# Patient Record
Sex: Male | Born: 1935 | Race: White | Hispanic: No | Marital: Married | Smoking: Former smoker
Health system: Southern US, Community
[De-identification: ages and names within clinical notes are randomized; demographics above are authoritative.]

## PROBLEM LIST (undated history)

## (undated) DIAGNOSIS — I1310 Hypertensive heart and chronic kidney disease without heart failure, with stage 1 through stage 4 chronic kidney disease, or unspecified chronic kidney disease: Secondary | ICD-10-CM

## (undated) DIAGNOSIS — K219 Gastro-esophageal reflux disease without esophagitis: Secondary | ICD-10-CM

## (undated) DIAGNOSIS — E782 Mixed hyperlipidemia: Secondary | ICD-10-CM

## (undated) DIAGNOSIS — I10 Essential (primary) hypertension: Secondary | ICD-10-CM

## (undated) DIAGNOSIS — I2510 Atherosclerotic heart disease of native coronary artery without angina pectoris: Secondary | ICD-10-CM

## (undated) DIAGNOSIS — I480 Paroxysmal atrial fibrillation: Secondary | ICD-10-CM

## (undated) DIAGNOSIS — B159 Hepatitis A without hepatic coma: Secondary | ICD-10-CM

## (undated) DIAGNOSIS — N183 Chronic kidney disease, stage 3 (moderate): Secondary | ICD-10-CM

## (undated) DIAGNOSIS — E1122 Type 2 diabetes mellitus with diabetic chronic kidney disease: Secondary | ICD-10-CM

## (undated) DIAGNOSIS — I252 Old myocardial infarction: Secondary | ICD-10-CM

## (undated) DIAGNOSIS — I429 Cardiomyopathy, unspecified: Secondary | ICD-10-CM

## (undated) DIAGNOSIS — Z86711 Personal history of pulmonary embolism: Secondary | ICD-10-CM

## (undated) DIAGNOSIS — I119 Hypertensive heart disease without heart failure: Secondary | ICD-10-CM

## (undated) HISTORY — DX: Chronic kidney disease, stage 3 (moderate): N18.3

## (undated) HISTORY — DX: Paroxysmal atrial fibrillation: I48.0

## (undated) HISTORY — DX: Hypertensive heart and chronic kidney disease without heart failure, with stage 1 through stage 4 chronic kidney disease, or unspecified chronic kidney disease: I13.10

## (undated) HISTORY — DX: Cardiomyopathy, unspecified: I42.9

## (undated) HISTORY — DX: Mixed hyperlipidemia: E78.2

## (undated) HISTORY — PX: APPENDECTOMY: SHX54

## (undated) HISTORY — DX: Hypertensive heart disease without heart failure: I11.9

## (undated) HISTORY — DX: Atherosclerotic heart disease of native coronary artery without angina pectoris: I25.10

## (undated) HISTORY — DX: Old myocardial infarction: I25.2

## (undated) HISTORY — PX: CORONARY ARTERY BYPASS GRAFT: SHX141

## (undated) HISTORY — DX: Type 2 diabetes mellitus with diabetic chronic kidney disease: E11.22

## (undated) HISTORY — DX: Essential (primary) hypertension: I10

## (undated) HISTORY — PX: CYSTOSCOPY: SUR368

## (undated) HISTORY — PX: PROSTATECTOMY: SHX69

## (undated) HISTORY — PX: CHOLECYSTECTOMY: SHX55

## (undated) HISTORY — PX: INSERT / REPLACE / REMOVE PACEMAKER: SUR710

## (undated) HISTORY — DX: Personal history of pulmonary embolism: Z86.711

## (undated) HISTORY — DX: Gastro-esophageal reflux disease without esophagitis: K21.9

## (undated) HISTORY — DX: Hepatitis a without hepatic coma: B15.9

---

## 2000-08-01 ENCOUNTER — Ambulatory Visit (HOSPITAL_COMMUNITY): Admission: RE | Admit: 2000-08-01 | Discharge: 2000-08-01 | Payer: Self-pay | Admitting: *Deleted

## 2003-11-25 ENCOUNTER — Ambulatory Visit (HOSPITAL_COMMUNITY): Admission: RE | Admit: 2003-11-25 | Discharge: 2003-11-25 | Payer: Self-pay | Admitting: General Surgery

## 2003-12-18 ENCOUNTER — Encounter: Admission: RE | Admit: 2003-12-18 | Discharge: 2003-12-18 | Payer: Self-pay | Admitting: General Surgery

## 2003-12-21 ENCOUNTER — Ambulatory Visit (HOSPITAL_COMMUNITY): Admission: RE | Admit: 2003-12-21 | Discharge: 2003-12-21 | Payer: Self-pay | Admitting: General Surgery

## 2004-02-25 ENCOUNTER — Encounter: Admission: RE | Admit: 2004-02-25 | Discharge: 2004-02-25 | Payer: Self-pay | Admitting: General Surgery

## 2004-02-29 ENCOUNTER — Ambulatory Visit (HOSPITAL_COMMUNITY): Admission: RE | Admit: 2004-02-29 | Discharge: 2004-02-29 | Payer: Self-pay | Admitting: General Surgery

## 2004-02-29 ENCOUNTER — Ambulatory Visit (HOSPITAL_BASED_OUTPATIENT_CLINIC_OR_DEPARTMENT_OTHER): Admission: RE | Admit: 2004-02-29 | Discharge: 2004-02-29 | Payer: Self-pay | Admitting: General Surgery

## 2006-02-07 IMAGING — CR DG CHEST 2V
2 series · 2 of 2 positions shown · non-contrast
Comparison: none

CLINICAL DATA: Preop respiratory exam surgery, left ear.  Post cardiac pacemaker, CABG.
CHEST, TWO VIEWS ? 02/25/04:
The heart size and mediastinal contours are unremarkable.  The lungs are clear.  The visualized skeleton is unremarkable.  There is no interval change since [HOSPITAL] at [HOSPITAL] chest x-ray of 12/18/03 with left sided dual lead permanent cardiac pacemaker and post CABG changes.
IMPRESSION
Since 12/18/03 - no active disease.

[view not recorded (1 of 2)]
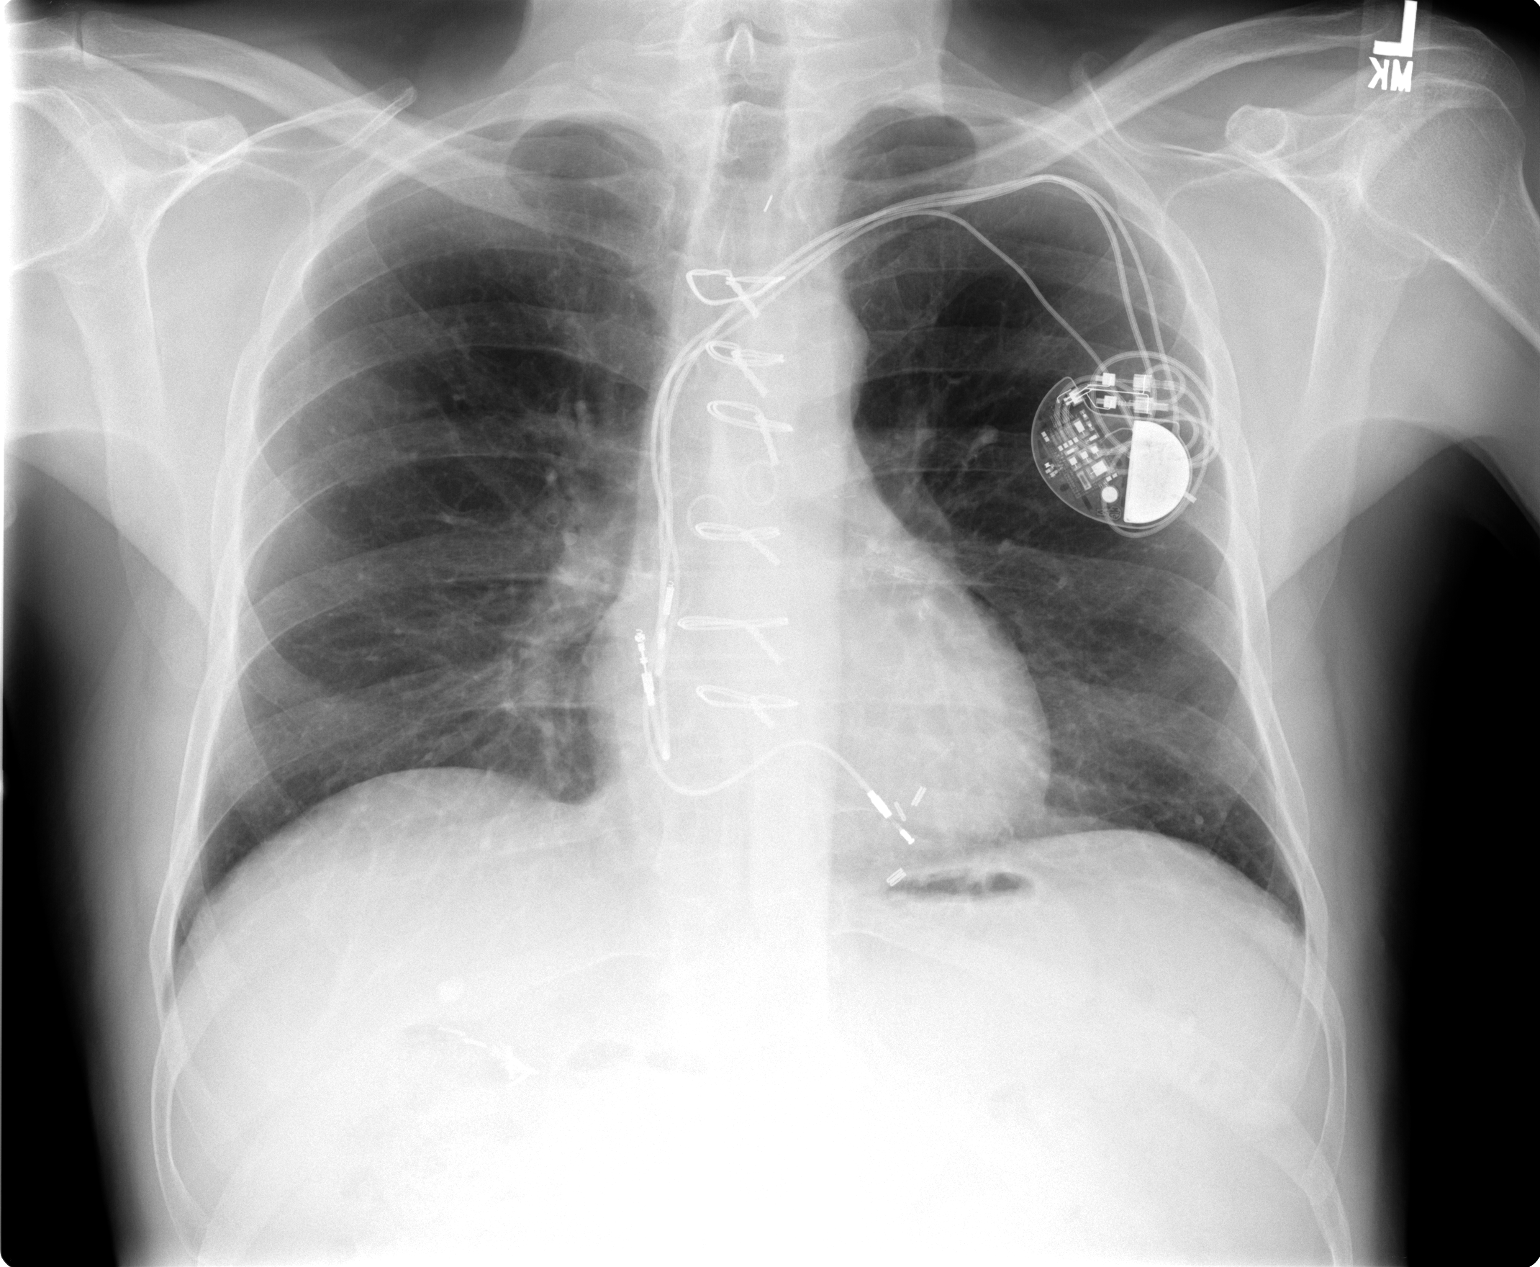

[view not recorded (2 of 2)]
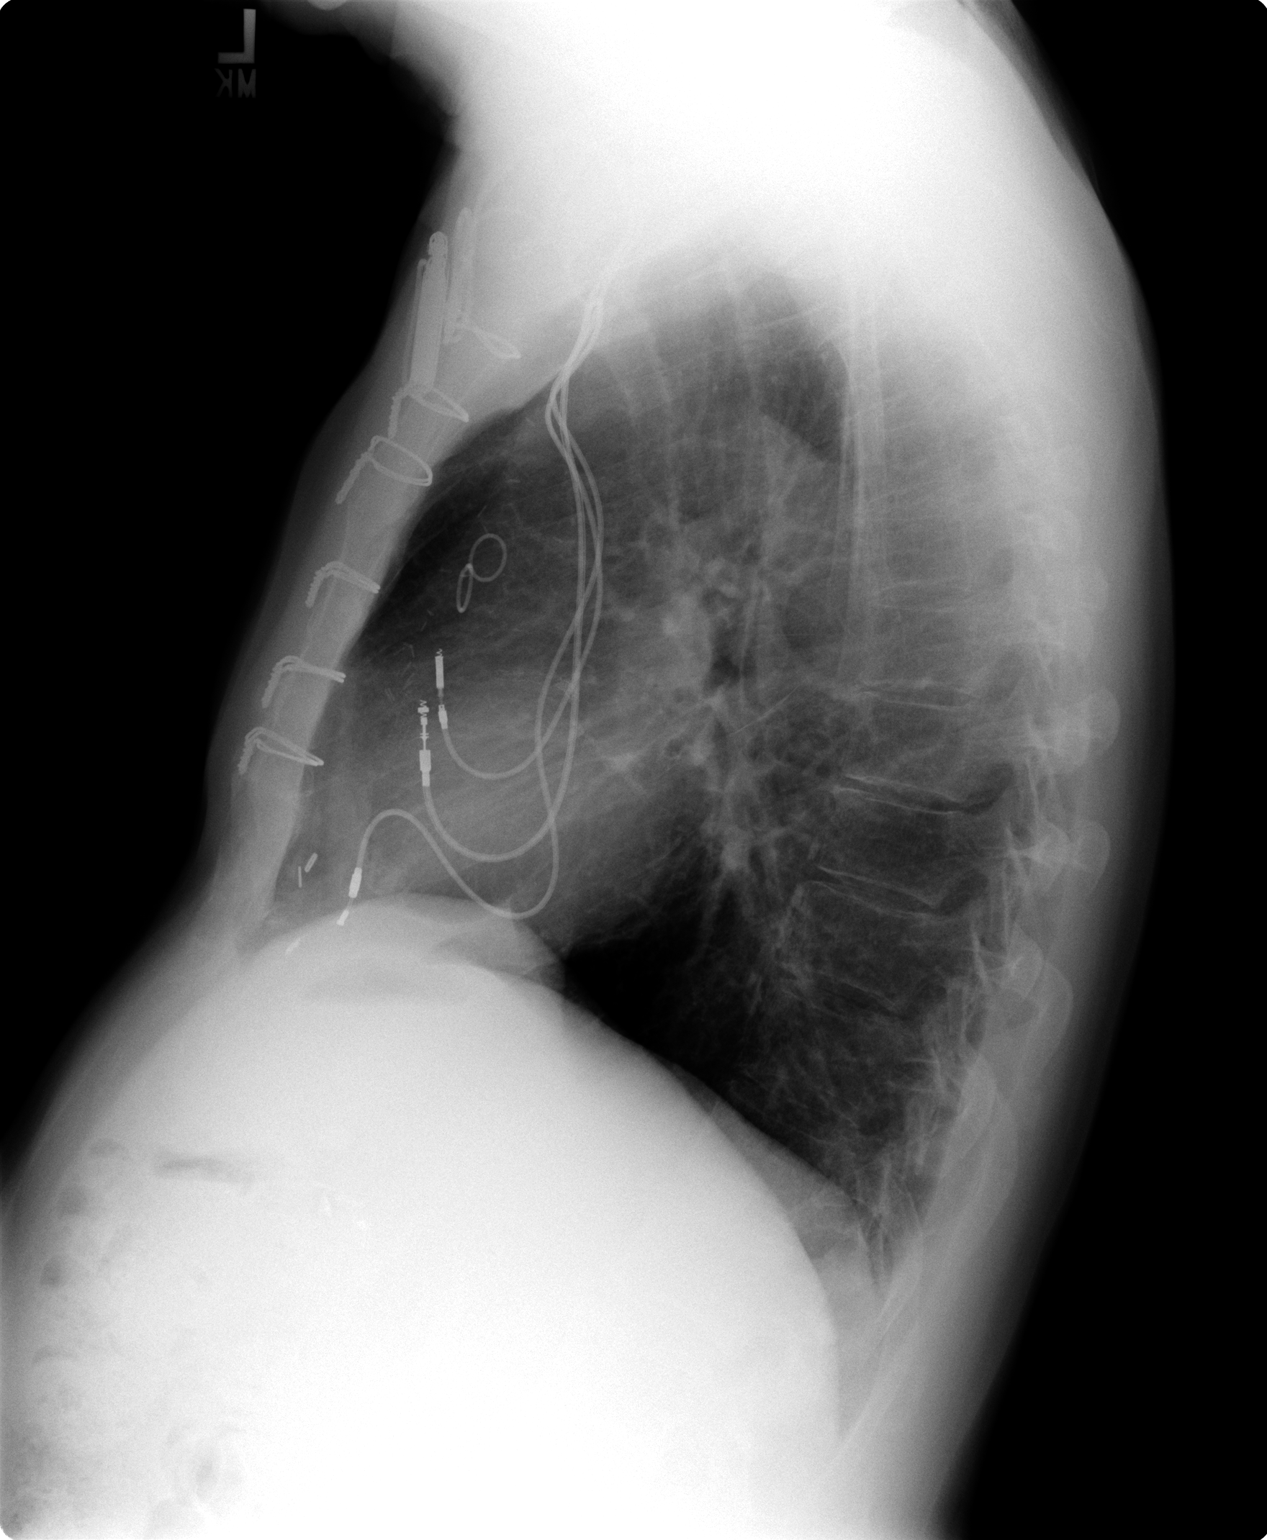

[2 of 2 positions shown; findings below may reference images not displayed]

## 2010-01-29 ENCOUNTER — Encounter: Payer: Self-pay | Admitting: General Surgery

## 2014-06-17 DIAGNOSIS — I119 Hypertensive heart disease without heart failure: Secondary | ICD-10-CM | POA: Insufficient documentation

## 2014-06-17 DIAGNOSIS — Z7901 Long term (current) use of anticoagulants: Secondary | ICD-10-CM

## 2014-06-17 DIAGNOSIS — Z950 Presence of cardiac pacemaker: Secondary | ICD-10-CM

## 2014-06-17 DIAGNOSIS — Z45018 Encounter for adjustment and management of other part of cardiac pacemaker: Secondary | ICD-10-CM

## 2014-06-17 HISTORY — DX: Presence of cardiac pacemaker: Z95.0

## 2014-06-17 HISTORY — DX: Long term (current) use of anticoagulants: Z79.01

## 2014-06-17 HISTORY — DX: Encounter for adjustment and management of other part of cardiac pacemaker: Z45.018

## 2014-06-17 HISTORY — DX: Hypertensive heart disease without heart failure: I11.9

## 2014-12-31 DIAGNOSIS — I442 Atrioventricular block, complete: Secondary | ICD-10-CM

## 2014-12-31 HISTORY — DX: Atrioventricular block, complete: I44.2

## 2015-05-31 DIAGNOSIS — I480 Paroxysmal atrial fibrillation: Secondary | ICD-10-CM | POA: Insufficient documentation

## 2015-05-31 HISTORY — DX: Paroxysmal atrial fibrillation: I48.0

## 2015-07-19 DIAGNOSIS — I1310 Hypertensive heart and chronic kidney disease without heart failure, with stage 1 through stage 4 chronic kidney disease, or unspecified chronic kidney disease: Secondary | ICD-10-CM

## 2015-07-19 DIAGNOSIS — Z7901 Long term (current) use of anticoagulants: Secondary | ICD-10-CM

## 2015-07-19 HISTORY — DX: Hypertensive heart and chronic kidney disease without heart failure, with stage 1 through stage 4 chronic kidney disease, or unspecified chronic kidney disease: I13.10

## 2015-07-19 HISTORY — DX: Long term (current) use of anticoagulants: Z79.01

## 2015-07-20 DIAGNOSIS — N400 Enlarged prostate without lower urinary tract symptoms: Secondary | ICD-10-CM | POA: Insufficient documentation

## 2015-07-20 DIAGNOSIS — Z86711 Personal history of pulmonary embolism: Secondary | ICD-10-CM

## 2015-07-20 DIAGNOSIS — I252 Old myocardial infarction: Secondary | ICD-10-CM

## 2015-07-20 DIAGNOSIS — I429 Cardiomyopathy, unspecified: Secondary | ICD-10-CM | POA: Insufficient documentation

## 2015-07-20 DIAGNOSIS — N183 Chronic kidney disease, stage 3 unspecified: Secondary | ICD-10-CM

## 2015-07-20 DIAGNOSIS — I2510 Atherosclerotic heart disease of native coronary artery without angina pectoris: Secondary | ICD-10-CM | POA: Insufficient documentation

## 2015-07-20 DIAGNOSIS — E1122 Type 2 diabetes mellitus with diabetic chronic kidney disease: Secondary | ICD-10-CM | POA: Insufficient documentation

## 2015-07-20 DIAGNOSIS — E782 Mixed hyperlipidemia: Secondary | ICD-10-CM

## 2015-07-20 DIAGNOSIS — I10 Essential (primary) hypertension: Secondary | ICD-10-CM | POA: Insufficient documentation

## 2015-07-20 DIAGNOSIS — I951 Orthostatic hypotension: Secondary | ICD-10-CM

## 2015-07-20 DIAGNOSIS — R5381 Other malaise: Secondary | ICD-10-CM

## 2015-07-20 DIAGNOSIS — H1130 Conjunctival hemorrhage, unspecified eye: Secondary | ICD-10-CM

## 2015-07-20 DIAGNOSIS — E119 Type 2 diabetes mellitus without complications: Secondary | ICD-10-CM

## 2015-07-20 DIAGNOSIS — K219 Gastro-esophageal reflux disease without esophagitis: Secondary | ICD-10-CM

## 2015-07-20 DIAGNOSIS — N529 Male erectile dysfunction, unspecified: Secondary | ICD-10-CM

## 2015-07-20 DIAGNOSIS — E538 Deficiency of other specified B group vitamins: Secondary | ICD-10-CM

## 2015-07-20 DIAGNOSIS — I25119 Atherosclerotic heart disease of native coronary artery with unspecified angina pectoris: Secondary | ICD-10-CM

## 2015-07-20 DIAGNOSIS — R5383 Other fatigue: Secondary | ICD-10-CM

## 2015-07-20 HISTORY — DX: Conjunctival hemorrhage, unspecified eye: H11.30

## 2015-07-20 HISTORY — DX: Other malaise: R53.81

## 2015-07-20 HISTORY — DX: Male erectile dysfunction, unspecified: N52.9

## 2015-07-20 HISTORY — DX: Type 2 diabetes mellitus with diabetic chronic kidney disease: E11.22

## 2015-07-20 HISTORY — DX: Benign prostatic hyperplasia without lower urinary tract symptoms: N40.0

## 2015-07-20 HISTORY — DX: Mixed hyperlipidemia: E78.2

## 2015-07-20 HISTORY — DX: Old myocardial infarction: I25.2

## 2015-07-20 HISTORY — DX: Deficiency of other specified B group vitamins: E53.8

## 2015-07-20 HISTORY — DX: Essential (primary) hypertension: I10

## 2015-07-20 HISTORY — DX: Atherosclerotic heart disease of native coronary artery without angina pectoris: I25.10

## 2015-07-20 HISTORY — DX: Chronic kidney disease, stage 3 unspecified: N18.30

## 2015-07-20 HISTORY — DX: Gastro-esophageal reflux disease without esophagitis: K21.9

## 2015-07-20 HISTORY — DX: Orthostatic hypotension: I95.1

## 2015-07-20 HISTORY — DX: Type 2 diabetes mellitus without complications: E11.9

## 2015-07-20 HISTORY — DX: Atherosclerotic heart disease of native coronary artery with unspecified angina pectoris: I25.119

## 2015-07-20 HISTORY — DX: Cardiomyopathy, unspecified: I42.9

## 2015-07-20 HISTORY — DX: Personal history of pulmonary embolism: Z86.711

## 2016-05-12 DIAGNOSIS — D638 Anemia in other chronic diseases classified elsewhere: Secondary | ICD-10-CM | POA: Insufficient documentation

## 2016-05-12 HISTORY — DX: Anemia in other chronic diseases classified elsewhere: D63.8

## 2017-04-24 DIAGNOSIS — I255 Ischemic cardiomyopathy: Secondary | ICD-10-CM

## 2017-07-05 ENCOUNTER — Telehealth: Payer: Self-pay | Admitting: Cardiology

## 2017-07-05 NOTE — Telephone Encounter (Signed)
Patient called and has scheduled an appt for RJK on 7/3.01/2017 and he is also due his PT then and wants to know if we can start following it and him have labs that day also? Please just let patient know if OK  And he is looking forward to coming back to us!!

## 2017-07-05 NOTE — Telephone Encounter (Signed)
Patient states he wishes to follow up with Dr. Dulce SellarMunley versus Dr. Bing MatterKrasowski. Appointment rescheduled with Dr. Dulce SellarMunley on 08/08/17 at 3:40 pm. Advised patient that assuming care of his coumadin management would have to be discussed with Dr. Dulce SellarMunley and he will be out of the office the rest of the week. Advised patient that we would discuss this with Dr. Dulce SellarMunley next week and return his call. Patient verbalized understanding. No further questions.

## 2017-07-10 NOTE — Telephone Encounter (Signed)
Would you be okay with taking this patient's coumadin over? Please advise?

## 2017-07-10 NOTE — Telephone Encounter (Signed)
Yes

## 2017-07-16 ENCOUNTER — Other Ambulatory Visit: Payer: Self-pay

## 2017-07-16 DIAGNOSIS — I480 Paroxysmal atrial fibrillation: Secondary | ICD-10-CM

## 2017-07-16 DIAGNOSIS — Z7901 Long term (current) use of anticoagulants: Secondary | ICD-10-CM

## 2017-07-16 NOTE — Telephone Encounter (Signed)
Appointment has been made for INR check when patient comes in to see Dr. Dulce SellarMunley. Per the patient he is not due to be checked again until the end of the month.

## 2017-07-23 ENCOUNTER — Other Ambulatory Visit: Payer: Self-pay

## 2017-08-08 ENCOUNTER — Other Ambulatory Visit: Payer: Self-pay

## 2017-08-08 ENCOUNTER — Ambulatory Visit: Payer: Medicare Other | Admitting: Cardiology

## 2017-08-08 ENCOUNTER — Ambulatory Visit (INDEPENDENT_AMBULATORY_CARE_PROVIDER_SITE_OTHER): Payer: Medicare Other

## 2017-08-08 DIAGNOSIS — Z7901 Long term (current) use of anticoagulants: Secondary | ICD-10-CM | POA: Diagnosis not present

## 2017-08-08 DIAGNOSIS — I480 Paroxysmal atrial fibrillation: Secondary | ICD-10-CM

## 2017-08-08 LAB — POCT INR: INR: 1.9 — AB (ref 2.0–3.0)

## 2017-08-08 MED ORDER — WARFARIN SODIUM 5 MG PO TABS: ORAL_TABLET | ORAL | 0 refills | Status: DC

## 2017-08-08 NOTE — Patient Instructions (Signed)
Description   Please take 10 mg today then continue current dose of 2.5 mg on Monday's and Thursday's and 5 mg the rest of the days. Please call our office with any medication changes or concerns [phone]. Be sure to keep your weekly intake of "greens" consistent.

## 2017-08-08 NOTE — Progress Notes (Deleted)
Cardiology Office Note:    Date:  08/08/2017   ID:  Greg Oconnor Farrugia, DOB 1925-01-22, MRN [id number]  PCP:  Gordan PaymentGrisso, Greg A., MD  Cardiologist:  Norman HerrlichBrian Raygan Skarda, MD    Referring MD: Gordan PaymentGrisso, Greg A., MD    ASSESSMENT:    1. Coronary artery disease involving native coronary artery of native heart with angina pectoris (HCC)   2. Paroxysmal atrial fibrillation (HCC)   3. Hypertensive heart disease without heart failure   4. Orthostatic hypotension   5. Anticoagulant long-term use   6. Mixed hyperlipidemia    PLAN:    In order of problems listed above:  1. ***   Next appointment: ***   Medication Adjustments/Labs and Tests Ordered: Current medicines are reviewed at length with the patient today.  Concerns regarding medicines are outlined above.  No orders of the defined types were placed in this encounter.  No orders of the defined types were placed in this encounter.   No chief complaint on file.   History of Present Illness:    Greg Oconnor Urias is a 84 y.o. male with a hx of CAD CABG 2005 dyslipidemia hypertension symptomatic hypotension  paroxysmal atrial fibrillation St Jude pacemaker and chronic anticoagulation for VTE [facility]. He is intolerant of all statins and ezetamide. Compliance with diet, lifestyle and medications: *** Past Medical History:  Diagnosis Date  . 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 CAD, multiple vessel 07/20/2015  . Cardiomyopathy (HCC) 07/20/2015  . Diabetes mellitus with stage 3 chronic kidney disease (HCC) 07/20/2015  . Essential hypertension 07/20/2015  . GERD without esophagitis 07/20/2015  . Hepatitis A   . History of pulmonary embolism 07/20/2015  . Hypertensive heart disease without heart failure 06/17/2014  . Mixed hyperlipidemia 07/20/2015  . Old myocardial infarction 07/20/2015  . Paroxysmal atrial fibrillation (HCC) 05/31/2015  . Stage 3 chronic kidney disease  (HCC) 07/20/2015    *** The histories are not reviewed yet. Please review them in the "History" navigator section and refresh this SmartLink.  Current Medications: No outpatient medications have been marked as taking for the 08/08/17 encounter (Appointment) with Baldo DaubMunley, Caileb Rhue J, MD.     Allergies:   Ezetimibe; Fenofibrate micronized; Fish oil; Niacin; Simvastatin; and Tetanus toxoid, adsorbed   Social History   Socioeconomic History  . Marital status: Married    Spouse name: Not on file  . Number of children: Not on file  . Years of education: Not on file  . Highest education level: Not on file  Occupational History  . Not on file  Social Needs  . Financial resource strain: Not on file  . Food insecurity:    Worry: Not on file    Inability: Not on file  . Transportation needs:    Medical: Not on file    Non-medical: Not on file  Tobacco Use  . Smoking status: Former Games developermoker  . Smokeless tobacco: Never Used  Substance and Sexual Activity  . Alcohol use: Not Currently  . Drug use: Not on file  . Sexual activity: Not on file  Lifestyle  . Physical activity:    Days per week: Not on file    Minutes per session: Not on file  . Stress: Not on file  Relationships  . Social connections:    Talks on phone: Not on file    Gets together: Not on file    Attends religious service: Not on file  Active member of club or organization: Not on file    Attends meetings of clubs or organizations: Not on file    Relationship status: Not on file  Other Topics Concern  . Not on file  Social History Narrative  . Not on file     Family History: The patient's ***family history includes Stroke in his mother. ROS:   Please see the history of present illness.    All other systems reviewed and are negative.  EKGs/Labs/Other Studies Reviewed:    The following studies were reviewed today:  EKG:  EKG ordered today.  The ekg ordered today demonstrates ***  Recent Labs:   05/14/17 CMP  with Bili 1.6 Cr 1.22 GFR 52 cc/min  No results found for requested labs within last 8760 hours.  Recent Lipid Panel   11/13/16 Chol 176 HDL 43 LDL 109 No results found for: CHOL, TRIG, HDL, CHOLHDL, VLDL, LDLCALC, LDLDIRECT  Physical Exam:    VS:  There were no vitals taken for this visit.    Wt Readings from Last 3 Encounters:  No data found for Wt     GEN: *** Well nourished, well developed in no acute distress HEENT: Normal NECK: No JVD; No carotid bruits LYMPHATICS: No lymphadenopathy CARDIAC: ***RRR, no murmurs, rubs, gallops RESPIRATORY:  Clear to auscultation without rales, wheezing or rhonchi  ABDOMEN: Soft, non-tender, non-distended MUSCULOSKELETAL:  No edema; No deformity  SKIN: Warm and dry NEUROLOGIC:  Alert and oriented x 3 PSYCHIATRIC:  Normal affect    Signed, [name], MD  08/08/2017 9:56 AM    [facility]

## 2017-08-15 ENCOUNTER — Ambulatory Visit (INDEPENDENT_AMBULATORY_CARE_PROVIDER_SITE_OTHER): Payer: Medicare Other

## 2017-08-15 DIAGNOSIS — Z7901 Long term (current) use of anticoagulants: Secondary | ICD-10-CM | POA: Diagnosis not present

## 2017-08-15 DIAGNOSIS — I480 Paroxysmal atrial fibrillation: Secondary | ICD-10-CM

## 2017-08-15 LAB — POCT INR: INR: 2.4 (ref 2.0–3.0)

## 2017-08-15 NOTE — Patient Instructions (Signed)
Description   Please take 7.5 mg today then continue current dose of 2.5 mg on Thursday's and 5 mg the rest of the days. Please call our office with any medication changes or concerns [phone]. Be sure to keep your weekly intake of "greens" consistent.

## 2017-08-20 ENCOUNTER — Encounter: Payer: Self-pay | Admitting: Cardiology

## 2017-08-20 ENCOUNTER — Ambulatory Visit (INDEPENDENT_AMBULATORY_CARE_PROVIDER_SITE_OTHER): Payer: Medicare Other | Admitting: Cardiology

## 2017-08-20 VITALS — BP 142/78 | HR 78 | Ht 67.5 in | Wt 137.2 lb

## 2017-08-20 DIAGNOSIS — I951 Orthostatic hypotension: Secondary | ICD-10-CM | POA: Diagnosis not present

## 2017-08-20 DIAGNOSIS — I25119 Atherosclerotic heart disease of native coronary artery with unspecified angina pectoris: Secondary | ICD-10-CM | POA: Diagnosis not present

## 2017-08-20 DIAGNOSIS — Z7901 Long term (current) use of anticoagulants: Secondary | ICD-10-CM

## 2017-08-20 DIAGNOSIS — I119 Hypertensive heart disease without heart failure: Secondary | ICD-10-CM | POA: Diagnosis not present

## 2017-08-20 DIAGNOSIS — Z950 Presence of cardiac pacemaker: Secondary | ICD-10-CM | POA: Diagnosis not present

## 2017-08-20 NOTE — Progress Notes (Signed)
Cardiology Office Note:    Date:  08/20/2017   ID:  Greg Oconnor, DOB 1925-07-18, MRN [id number]  PCP:  [name], MD  Cardiologist:  [name], MD    Referring MD: [name], MD    ASSESSMENT:    1. Coronary artery disease involving native coronary artery of native heart with angina pectoris (HCC)   2. Hypertensive heart disease without heart failure   3. Orthostatic hypotension   4. Pacemaker   5. Anticoagulant long-term use    PLAN:    In order of problems listed above:  1. Stable continue medical therapy.  He is not tolerant of vasodilators of beta-blocker with symptomatic hypotension.  He is intolerant of statins and acetamide will continue Questran and I think he has had a good clinical response 2. Stable no evidence of fluid overload presently does not require diuretic 3. Improved patient stopped taking a small dose of ACE inhibitor I agree with 4. Stable transition to device clinic through my office for supervision 5. Continue warfarin goal INR is 2-2.5   Next appointment: 6 months   Medication Adjustments/Labs and Tests Ordered: Current medicines are reviewed at length with the patient today.  Concerns regarding medicines are outlined above.  No orders of the defined types were placed in this encounter.  No orders of the defined types were placed in this encounter.   Chief Complaint  Patient presents with  . Follow-up    History of Present Illness:    Greg Oconnor is a 84 y.o. male with a hx of CAD CABG 2005 hypertension hypotension hyperlipidemia pacemaker for heart block St Jude and chronic anticoagulation last seen by me 03/15/16 at [facility]. He had an echo done 04/26/17 results unavailable. Compliance with diet, lifestyle and medications: Yes  He continues to be a vigorous man with a great deal of gardening and continues to have episodes were in hot weather with prolonged activity he gets lightheaded.  He is unable to  tolerate any vasodilators had syncope with documented hypotension in the past and his home blood pressures run in the range of 1 10-1 20 systolic without ACE inhibitor.  He has had no angina dyspnea palpitation or syncope.  I reviewed records and echocardiogram was done in my previous practice in the last year showed an ejection fraction in the range of 35% but the patient was unaware of the results.  His warfarin is managed in our practice presently.  His pacemaker is followed in my previous practice and he will transition over to device clinic through my office. Past Medical History:  Diagnosis Date  . 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 CAD, multiple vessel 07/20/2015  . Cardiomyopathy (HCC) 07/20/2015  . Diabetes mellitus with stage 3 chronic kidney disease (HCC) 07/20/2015  . Essential hypertension 07/20/2015  . GERD without esophagitis 07/20/2015  . Hepatitis A   . History of pulmonary embolism 07/20/2015  . Hypertensive heart disease without heart failure 06/17/2014  . Mixed hyperlipidemia 07/20/2015  . Old myocardial infarction 07/20/2015  . Paroxysmal atrial fibrillation (HCC) 05/31/2015  . Stage 3 chronic kidney disease (HCC) 07/20/2015    Past Surgical History:  Procedure Laterality Date  . APPENDECTOMY    . CHOLECYSTECTOMY    . CORONARY ARTERY BYPASS GRAFT    . CYSTOSCOPY    . INSERT / REPLACE / REMOVE PACEMAKER     St. Jude  . PROSTATECTOMY      Current  Medications: Current Meds  Medication Sig  . cholestyramine (QUESTRAN) 4 GM/DOSE powder TAKE AS DIRECTED.  . folic acid (FOLVITE) 400 MCG tablet Take 400 mcg by mouth daily.   . nitroGLYCERIN (NITROSTAT) 0.4 MG SL tablet Place 1 tablet under the tongue as needed for chest pain.  . pantoprazole (PROTONIX) 40 MG tablet Take 1 tablet by mouth daily.  Marland Kitchen. warfarin (COUMADIN) 5 MG tablet Take as directed by the coumadin clinic.     Allergies:   Ezetimibe; Fenofibrate micronized; Fish oil;  Niacin; Simvastatin; and Tetanus toxoid, adsorbed   Social History   Socioeconomic History  . Marital status: Married    Spouse name: Not on file  . Number of children: Not on file  . Years of education: Not on file  . Highest education level: Not on file  Occupational History  . Not on file  Social Needs  . Financial resource strain: Not on file  . Food insecurity:    Worry: Not on file    Inability: Not on file  . Transportation needs:    Medical: Not on file    Non-medical: Not on file  Tobacco Use  . Smoking status: Former Games developermoker  . Smokeless tobacco: Never Used  Substance and Sexual Activity  . Alcohol use: Not Currently  . Drug use: Never  . Sexual activity: Not on file  Lifestyle  . Physical activity:    Days per week: Not on file    Minutes per session: Not on file  . Stress: Not on file  Relationships  . Social connections:    Talks on phone: Not on file    Gets together: Not on file    Attends religious service: Not on file    Active member of club or organization: Not on file    Attends meetings of clubs or organizations: Not on file    Relationship status: Not on file  Other Topics Concern  . Not on file  Social History Narrative  . Not on file     Family History: The patient's family history includes Cancer in his father; Heart Problems in his brother and mother; Parkinson's disease in his brother; Stroke in his mother. ROS:   Please see the history of present illness.    All other systems reviewed and are negative.  EKGs/Labs/Other Studies Reviewed:    The following studies were reviewed today:  EKG:  EKG ordered today.  The ekg ordered today demonstrates normal dual-chamber pacemaker function  Recent Labs:   05/14/17 CMP normal except Bili 1.6 BUN 32 Cr 1.22 No results found for requested labs within last 8760 hours.  Recent Lipid Panel  11/13/16 Chol 176 HDL 43 LDL 109 No results found for: CHOL, TRIG, HDL, CHOLHDL, VLDL, LDLCALC,  LDLDIRECT  Physical Exam:    VS:  BP (!) 142/78 (BP Location: Right Arm, Patient Position: Sitting, Cuff Size: Normal)   Pulse 78   Ht 5' 7.5" (1.715 m)   Wt 137 lb 3.2 oz (62.2 kg)   SpO2 97%   BMI 21.17 kg/m     Wt Readings from Last 3 Encounters:  08/20/17 137 lb 3.2 oz (62.2 kg)     GEN:  Well nourished, well developed in no acute distress HEENT: Normal NECK: No JVD; No carotid bruits LYMPHATICS: No lymphadenopathy CARDIAC: RRR, no murmurs, rubs, gallops RESPIRATORY:  Clear to auscultation without rales, wheezing or rhonchi  ABDOMEN: Soft, non-tender, non-distended MUSCULOSKELETAL:  No edema; No deformity  SKIN: Warm and dry  NEUROLOGIC:  Alert and oriented x 3 PSYCHIATRIC:  Normal affect    Signed, Norman HerrlichBrian Zareah Hunzeker, MD  08/20/2017 3:52 PM    [facility]

## 2017-08-20 NOTE — Patient Instructions (Signed)
Medication Instructions:  Your physician recommends that you continue on your current medications as directed. Please refer to the Current Medication list given to you today.   Labwork: None.  Testing/Procedures:  None.  Follow-Up: Your physician wants you to follow-up in: 6 months. You will receive a reminder letter in the mail two months in advance. If you don't receive a letter, please call our office to schedule the follow-up appointment.   Any Other Special Instructions Will Be Listed Below (If Applicable).   You have been referred to see Dr. Elberta Fortisamnitz with electrophysiology. Their office should call you within one week, if not please call our office.     If you need a refill on your cardiac medications before your next appointment, please call your pharmacy.

## 2017-08-22 ENCOUNTER — Ambulatory Visit (INDEPENDENT_AMBULATORY_CARE_PROVIDER_SITE_OTHER): Payer: Medicare Other

## 2017-08-22 DIAGNOSIS — I480 Paroxysmal atrial fibrillation: Secondary | ICD-10-CM

## 2017-08-22 DIAGNOSIS — Z7901 Long term (current) use of anticoagulants: Secondary | ICD-10-CM

## 2017-08-22 LAB — POCT INR: INR: 3.4 — AB (ref 2.0–3.0)

## 2017-08-22 NOTE — Patient Instructions (Signed)
Description   Hold today's dose then continue current dose of 2.5 mg on Thursday's and 5 mg the rest of the days. Please call our office with any medication changes or concerns [phone]) [phone]. Be sure to keep your weekly intake of "greens" consistent.

## 2017-08-29 ENCOUNTER — Ambulatory Visit (INDEPENDENT_AMBULATORY_CARE_PROVIDER_SITE_OTHER): Payer: Medicare Other

## 2017-08-29 DIAGNOSIS — I480 Paroxysmal atrial fibrillation: Secondary | ICD-10-CM

## 2017-08-29 DIAGNOSIS — Z7901 Long term (current) use of anticoagulants: Secondary | ICD-10-CM

## 2017-08-29 LAB — POCT INR: INR: 2.9 (ref 2.0–3.0)

## 2017-08-29 NOTE — Patient Instructions (Signed)
Description   Continue current dose of 2.5 mg on Thursday's and 5 mg the rest of the days. Please call our office with any medication changes or concerns [phone]. Be sure to keep your weekly intake of "greens" consistent.

## 2017-09-11 ENCOUNTER — Ambulatory Visit (INDEPENDENT_AMBULATORY_CARE_PROVIDER_SITE_OTHER): Payer: Medicare Other | Admitting: Pharmacist

## 2017-09-11 DIAGNOSIS — Z7901 Long term (current) use of anticoagulants: Secondary | ICD-10-CM | POA: Diagnosis not present

## 2017-09-11 DIAGNOSIS — I480 Paroxysmal atrial fibrillation: Secondary | ICD-10-CM

## 2017-09-11 LAB — POCT INR: INR: 4 — AB (ref 2.0–3.0)

## 2017-09-11 NOTE — Patient Instructions (Signed)
Description   Skip dose today then change dose to 2.5 mg on Mondays and Thursdays and 5 mg the rest of the days. Recheck INR in 2 weeks. Please call our office with any medication changes or concerns [phone]. Be sure to keep your weekly intake of "greens" consistent.

## 2017-09-25 ENCOUNTER — Ambulatory Visit (INDEPENDENT_AMBULATORY_CARE_PROVIDER_SITE_OTHER): Payer: Medicare Other | Admitting: Pharmacist

## 2017-09-25 DIAGNOSIS — Z7901 Long term (current) use of anticoagulants: Secondary | ICD-10-CM | POA: Diagnosis not present

## 2017-09-25 DIAGNOSIS — I480 Paroxysmal atrial fibrillation: Secondary | ICD-10-CM | POA: Diagnosis not present

## 2017-09-25 LAB — POCT INR: INR: 2.5 (ref 2.0–3.0)

## 2017-09-25 NOTE — Patient Instructions (Signed)
Description   Continue 2.5 mg on Mondays and Thursdays and 5 mg the rest of the days. Recheck INR in 3 weeks. Please call our office with any medication changes or concerns [phone]. Be sure to keep your weekly intake of "greens" consistent.

## 2017-10-16 ENCOUNTER — Ambulatory Visit (INDEPENDENT_AMBULATORY_CARE_PROVIDER_SITE_OTHER): Payer: Medicare Other | Admitting: Pharmacist

## 2017-10-16 DIAGNOSIS — I480 Paroxysmal atrial fibrillation: Secondary | ICD-10-CM

## 2017-10-16 DIAGNOSIS — Z7901 Long term (current) use of anticoagulants: Secondary | ICD-10-CM

## 2017-10-16 LAB — POCT INR: INR: 2.9 (ref 2.0–3.0)

## 2017-10-16 NOTE — Patient Instructions (Signed)
Description   Continue 2.5 mg on Mondays and Thursdays and 5 mg the rest of the days. Recheck INR in 4 weeks. Please call our office with any medication changes or concerns [phone]. Be sure to keep your weekly intake of "greens" consistent.

## 2017-11-05 ENCOUNTER — Ambulatory Visit (INDEPENDENT_AMBULATORY_CARE_PROVIDER_SITE_OTHER): Payer: Medicare Other | Admitting: Cardiology

## 2017-11-05 ENCOUNTER — Encounter: Payer: Self-pay | Admitting: Cardiology

## 2017-11-05 VITALS — BP 134/82 | HR 89 | Ht 67.54 in | Wt 137.0 lb

## 2017-11-05 DIAGNOSIS — I480 Paroxysmal atrial fibrillation: Secondary | ICD-10-CM

## 2017-11-05 DIAGNOSIS — I442 Atrioventricular block, complete: Secondary | ICD-10-CM | POA: Diagnosis not present

## 2017-11-05 DIAGNOSIS — E785 Hyperlipidemia, unspecified: Secondary | ICD-10-CM

## 2017-11-05 DIAGNOSIS — I2510 Atherosclerotic heart disease of native coronary artery without angina pectoris: Secondary | ICD-10-CM | POA: Diagnosis not present

## 2017-11-05 LAB — CUP PACEART INCLINIC DEVICE CHECK
Battery Impedance: 6000 Ohm
Battery Voltage: 2.72 V
Date Time Interrogation Session: 20191028125508
Implantable Lead Implant Date: 19980122
Implantable Lead Implant Date: 20040315
Implantable Lead Location: 753859
Implantable Lead Location: 753860
Implantable Pulse Generator Implant Date: 20080827
Lead Channel Impedance Value: 484 Ohm
Lead Channel Pacing Threshold Amplitude: 0.5 V
Lead Channel Pacing Threshold Amplitude: 5.5 V
Lead Channel Pacing Threshold Pulse Width: 0.4 ms
Lead Channel Pacing Threshold Pulse Width: 1 ms
Lead Channel Setting Pacing Amplitude: 0 V
Lead Channel Setting Pacing Pulse Width: 0.4 ms
Lead Channel Setting Sensing Sensitivity: 2 mV
Pulse Gen Model: 5816
Pulse Gen Serial Number: 1180998

## 2017-11-05 NOTE — Progress Notes (Signed)
Electrophysiology Office Note   Date:  11/05/2017   ID:  Greg Oconnor, DOB 1925-05-18, MRN [id number]  PCP:  [name], MD  Cardiologist:  Dulce Sellar Primary Electrophysiologist:  Regan Lemming, MD    No chief complaint on file.    History of Present Illness: Greg Oconnor is a 84 y.o. male who is being seen today for the evaluation of cardiomyopathy at the request of [name]. Presenting today for electrophysiology evaluation.  Has a history of coronary artery disease,, hypertension, hyperlipidemia, Saint Jude pacemaker for heart block.  He presents today for initial visit for device follow-up.  Today, he denies symptoms of palpitations, chest pain, shortness of breath, orthopnea, PND, lower extremity edema, claudication, dizziness, presyncope, syncope, bleeding, or neurologic sequela. The patient is tolerating medications without difficulties.    Past Medical History:  Diagnosis Date  . 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 CAD, multiple vessel 07/20/2015  . Cardiomyopathy (HCC) 07/20/2015  . Diabetes mellitus with stage 3 chronic kidney disease (HCC) 07/20/2015  . Essential hypertension 07/20/2015  . GERD without esophagitis 07/20/2015  . Hepatitis A   . History of pulmonary embolism 07/20/2015  . Hypertensive heart disease without heart failure 06/17/2014  . Mixed hyperlipidemia 07/20/2015  . Old myocardial infarction 07/20/2015  . Paroxysmal atrial fibrillation (HCC) 05/31/2015  . Stage 3 chronic kidney disease (HCC) 07/20/2015   Past Surgical History:  Procedure Laterality Date  . APPENDECTOMY    . CHOLECYSTECTOMY    . CORONARY ARTERY BYPASS GRAFT    . CYSTOSCOPY    . INSERT / REPLACE / REMOVE PACEMAKER     St. Jude  . PROSTATECTOMY       Current Outpatient Medications  Medication Sig Dispense Refill  . cetirizine (ZYRTEC) 10 MG tablet Take 10 mg by mouth daily as needed.     . cholestyramine (QUESTRAN) 4  GM/DOSE powder TAKE AS DIRECTED.    . folic acid (FOLVITE) 400 MCG tablet Take 400 mcg by mouth daily.     . nitroGLYCERIN (NITROSTAT) 0.4 MG SL tablet Place 1 tablet under the tongue as needed for chest pain.    . pantoprazole (PROTONIX) 40 MG tablet Take 1 tablet by mouth daily.    Marland Kitchen warfarin (COUMADIN) 5 MG tablet Take as directed by the coumadin clinic. 60 tablet 0   No current facility-administered medications for this visit.     Allergies:   Ezetimibe; Fenofibrate micronized; Fish oil; Niacin; Simvastatin; and Tetanus toxoid, adsorbed   Social History:  The patient  reports that he has quit smoking. He has never used smokeless tobacco. He reports that he drank alcohol. He reports that he does not use drugs.   Family History:  The patient's family history includes Cancer in his father; Heart Problems in his brother and mother; Parkinson's disease in his brother; Stroke in his mother.    ROS:  Please see the history of present illness.   Otherwise, review of systems is positive for none.   All other systems are reviewed and negative.    PHYSICAL EXAM: VS:  BP 134/82   Pulse 89   Ht 5' 7.54" (1.716 m)   Wt 137 lb (62.1 kg)   SpO2 98%   BMI 21.12 kg/m  , BMI Body mass index is 21.12 kg/m. GEN: Well nourished, well developed, in no acute distress  HEENT: normal  Neck: no JVD, carotid bruits, or masses Cardiac: RRR; no murmurs, rubs, or  gallops,no edema  Respiratory:  clear to auscultation bilaterally, normal work of breathing GI: soft, nontender, nondistended, + BS MS: no deformity or atrophy  Skin: warm and dry,  device pocket is well healed Neuro:  Strength and sensation are intact Psych: euthymic mood, full affect  EKG:  EKG is not ordered today. Personal review of the ekg ordered 08/20/17 shows A sense, V paced  Device interrogation is reviewed today in detail.  See PaceArt for details.   Recent Labs: No results found for requested labs within last 8760 hours.     Lipid Panel  No results found for: CHOL, TRIG, HDL, CHOLHDL, VLDL, LDLCALC, LDLDIRECT   Wt Readings from Last 3 Encounters:  11/05/17 137 lb (62.1 kg)  08/20/17 137 lb 3.2 oz (62.2 kg)      Other studies Reviewed: Additional studies/ records that were reviewed today include: TTE 04/24/2017 Review of the above records today demonstrates:  Ejection fraction 30%.  Left atrial enlargement.  No significant aortic stenosis.  Mild to moderate mitral regurgitation.   ASSESSMENT AND PLAN:  1.  Complete heart block: Status post [facility] Jude dual-chamber pacemaker.  His atrial lead threshold is chronically elevated.  He rarely atrial paces.  Otherwise no changes.  2.  Coronary artery disease: No current chest pain.  Per primary cardiology.  3.  Hypertension: Mildly elevated today.  Normal on past checks.  No changes.  4.  Paroxysmal atrial fibrillation: Currently on warfarin.  This patients CHA2DS2-VASc Score and unadjusted Ischemic Stroke Rate (% per year) is equal to 4.8 % stroke rate/year from a score of 4  Above score calculated as 1 point each if present [CHF, HTN, DM, Vascular=MI/PAD/Aortic Plaque, Age if 65-74, or Male] Above score calculated as 2 points each if present [Age > 75, or Stroke/TIA/TE]   Current medicines are reviewed at length with the patient today.   The patient does not have concerns regarding his medicines.  The following changes were made today:  none  Labs/ tests ordered today include:  No orders of the defined types were placed in this encounter.    Disposition:   FU with Kaeleb Emond 1 year  Signed, Ethel Veronica Jorja Loa, MD  11/05/2017 10:20 AM     [facility] [address] [place] [address] [phone] (office) [phone] (fax)

## 2017-11-05 NOTE — Patient Instructions (Addendum)
Medication Instructions:  Your physician recommends that you continue on your current medications as directed. Please refer to the Current Medication list given to you today.  *If you need a refill on your cardiac medications before your next appointment, please call your pharmacy*  Labwork: None ordered  Testing/Procedures: None ordered  Follow-Up: Your physician wants you to follow-up in: 6 months with device clinic. You will receive a reminder letter in the mail two months in advance. If you don't receive a letter, please call our office to schedule the follow-up appointment.   Your physician wants you to follow-up in: 1 year with Dr. [name].  You will receive a reminder letter in the mail two months in advance. If you don't receive a letter, please call our office to schedule the follow-up appointment.  Thank you for choosing [facility]!!   Dory Horn, RN [phone]  Any Other Special Instructions Will Be Listed Below (If Applicable). Device clinic phone number:  [phone]

## 2017-11-13 ENCOUNTER — Ambulatory Visit (INDEPENDENT_AMBULATORY_CARE_PROVIDER_SITE_OTHER): Payer: Medicare Other | Admitting: Pharmacist

## 2017-11-13 DIAGNOSIS — Z7901 Long term (current) use of anticoagulants: Secondary | ICD-10-CM | POA: Diagnosis not present

## 2017-11-13 DIAGNOSIS — I480 Paroxysmal atrial fibrillation: Secondary | ICD-10-CM | POA: Diagnosis not present

## 2017-11-13 LAB — POCT INR: INR: 2.7 (ref 2.0–3.0)

## 2017-11-13 NOTE — Patient Instructions (Signed)
Description   Continue 2.5 mg on Mondays and Thursdays and 5 mg the rest of the days. Recheck INR in 5 weeks. Please call our office with any medication changes or concerns [phone]. Be sure to keep your weekly intake of "greens" consistent.

## 2017-12-18 ENCOUNTER — Ambulatory Visit (INDEPENDENT_AMBULATORY_CARE_PROVIDER_SITE_OTHER): Payer: Medicare Other | Admitting: Pharmacist

## 2017-12-18 DIAGNOSIS — I480 Paroxysmal atrial fibrillation: Secondary | ICD-10-CM

## 2017-12-18 DIAGNOSIS — Z7901 Long term (current) use of anticoagulants: Secondary | ICD-10-CM

## 2017-12-18 LAB — POCT INR: INR: 3.2 — AB (ref 2.0–3.0)

## 2017-12-18 NOTE — Patient Instructions (Signed)
Description   Skip dose today then continue 2.5 mg on Mondays and Thursdays and 5 mg the rest of the days. Recheck INR in 4 weeks. Please call our office with any medication changes or concerns [phone]. Be sure to keep your weekly intake of "greens" consistent.

## 2017-12-25 ENCOUNTER — Other Ambulatory Visit: Payer: Self-pay | Admitting: Cardiology

## 2018-01-15 ENCOUNTER — Ambulatory Visit (INDEPENDENT_AMBULATORY_CARE_PROVIDER_SITE_OTHER): Payer: Medicare Other | Admitting: Pharmacist

## 2018-01-15 DIAGNOSIS — I480 Paroxysmal atrial fibrillation: Secondary | ICD-10-CM | POA: Diagnosis not present

## 2018-01-15 DIAGNOSIS — Z7901 Long term (current) use of anticoagulants: Secondary | ICD-10-CM

## 2018-01-15 LAB — POCT INR: INR: 2.1 (ref 2.0–3.0)

## 2018-01-15 NOTE — Patient Instructions (Signed)
Description   Today take 1.5 tablets, then continue 2.5 mg on Mondays and Thursdays and 5 mg the rest of the days. Recheck INR in 3 weeks. Please call our office with any medication changes or concerns [phone]. Be sure to keep your weekly intake of "greens" consistent.

## 2018-02-05 ENCOUNTER — Ambulatory Visit (INDEPENDENT_AMBULATORY_CARE_PROVIDER_SITE_OTHER): Payer: Medicare Other | Admitting: *Deleted

## 2018-02-05 DIAGNOSIS — I480 Paroxysmal atrial fibrillation: Secondary | ICD-10-CM | POA: Diagnosis not present

## 2018-02-05 DIAGNOSIS — Z7901 Long term (current) use of anticoagulants: Secondary | ICD-10-CM

## 2018-02-05 LAB — POCT INR: INR: 2.2 (ref 2.0–3.0)

## 2018-02-05 NOTE — Patient Instructions (Signed)
Description   Today take 1.5 tablets, then change your dose to 1 tablet daily except 1/2 tablet on Mondays.  Recheck INR in 2 weeks. Please call our office with any medication changes or concerns [phone]. Be sure to keep your weekly intake of "greens" consistent.

## 2018-02-19 ENCOUNTER — Ambulatory Visit (INDEPENDENT_AMBULATORY_CARE_PROVIDER_SITE_OTHER): Payer: Medicare Other | Admitting: *Deleted

## 2018-02-19 DIAGNOSIS — I480 Paroxysmal atrial fibrillation: Secondary | ICD-10-CM

## 2018-02-19 DIAGNOSIS — Z7901 Long term (current) use of anticoagulants: Secondary | ICD-10-CM | POA: Diagnosis not present

## 2018-02-19 LAB — POCT INR: INR: 2.2 (ref 2.0–3.0)

## 2018-02-19 NOTE — Patient Instructions (Signed)
Description   Today take 1.5 tablets, then change your dose to 1 tablet daily. Recheck INR in 2 weeks. Please call our office with any medication changes or concerns [phone]. Be sure to keep your weekly intake of "greens" consistent.

## 2018-02-25 ENCOUNTER — Other Ambulatory Visit: Payer: Self-pay | Admitting: Cardiology

## 2018-03-05 ENCOUNTER — Ambulatory Visit (INDEPENDENT_AMBULATORY_CARE_PROVIDER_SITE_OTHER): Payer: Medicare Other | Admitting: Pharmacist

## 2018-03-05 DIAGNOSIS — I480 Paroxysmal atrial fibrillation: Secondary | ICD-10-CM | POA: Diagnosis not present

## 2018-03-05 DIAGNOSIS — Z5181 Encounter for therapeutic drug level monitoring: Secondary | ICD-10-CM

## 2018-03-05 LAB — POCT INR: INR: 2.4 (ref 2.0–3.0)

## 2018-03-05 NOTE — Patient Instructions (Signed)
Today take 1.5 tablets, then continue 1 tablet daily. Recheck INR in 2 weeks. Please call our office with any medication changes or concerns [phone]. Be sure to keep your weekly intake of "greens" consistent.

## 2018-03-15 ENCOUNTER — Encounter: Payer: Self-pay | Admitting: Cardiology

## 2018-03-15 ENCOUNTER — Ambulatory Visit (INDEPENDENT_AMBULATORY_CARE_PROVIDER_SITE_OTHER): Payer: Medicare Other | Admitting: Cardiology

## 2018-03-15 VITALS — BP 138/80 | HR 84 | Ht 67.0 in | Wt 140.6 lb

## 2018-03-15 DIAGNOSIS — I951 Orthostatic hypotension: Secondary | ICD-10-CM | POA: Diagnosis not present

## 2018-03-15 DIAGNOSIS — I25119 Atherosclerotic heart disease of native coronary artery with unspecified angina pectoris: Secondary | ICD-10-CM

## 2018-03-15 DIAGNOSIS — I442 Atrioventricular block, complete: Secondary | ICD-10-CM

## 2018-03-15 DIAGNOSIS — Z950 Presence of cardiac pacemaker: Secondary | ICD-10-CM

## 2018-03-15 DIAGNOSIS — E782 Mixed hyperlipidemia: Secondary | ICD-10-CM

## 2018-03-15 DIAGNOSIS — Z7901 Long term (current) use of anticoagulants: Secondary | ICD-10-CM

## 2018-03-15 DIAGNOSIS — I10 Essential (primary) hypertension: Secondary | ICD-10-CM

## 2018-03-15 MED ORDER — WARFARIN SODIUM 5 MG PO TABS: ORAL_TABLET | ORAL | 0 refills | Status: DC

## 2018-03-15 NOTE — Patient Instructions (Signed)

## 2018-03-15 NOTE — Progress Notes (Signed)
Cardiology Office Note:    Date:  03/15/2018   ID:  Greg Oconnor, DOB Jul 18, 1925, MRN [id number]  PCP:  [name], MD  Cardiologist:  [name], MD    Referring MD: [name], MD    ASSESSMENT:    1. Coronary artery disease involving native coronary artery of native heart with angina pectoris (HCC)   2. Essential hypertension   3. Orthostatic hypotension   4. Atrioventricular block, complete (HCC)   5. Pacemaker   6. Anticoagulant long-term use   7. Mixed hyperlipidemia    PLAN:    In order of problems listed above:  1. Stable New York Heart Association class I continue his current medical treatment I would not advise an ischemia evaluation 2. Stable blood pressure at target he titrates his ACE inhibitor and is able to avoid symptomatic orthostatic hypotension 3. Stable no recurrence 4. Stable pacemaker followed in our device clinic asymptomatic 5. See above 6. Continue warfarin INR goal 2.5 managed on warfarin clinic 7. Stable he is absolutely intolerant of statins and Zetia and will continue with Questran.   Next appointment: 6 months   Medication Adjustments/Labs and Tests Ordered: Current medicines are reviewed at length with the patient today.  Concerns regarding medicines are outlined above.  No orders of the defined types were placed in this encounter.  No orders of the defined types were placed in this encounter.   No chief complaint on file.   History of Present Illness:    Greg Oconnor is a 85 y.o. male with a hx of CAD CABG 2005 hypertension hypotension hyperlipidemia pacemaker for heart block St Jude and chronic anticoagulation last seen 08/20/17.  His last device check 2028 2019 with stable parameters estimated device life of 1.75 to 2.25 years and continues to be followed in our pacemaker clinic. Compliance with diet, lifestyle and medications: Yes  Overall is pleased with the quality of his life he lives alone but he remains  active he is getting ready to start his current migrans vegetables.  He titrates his ACE inhibitor depending on blood pressure has had no symptomatic orthostatic hypotension he has no pacemaker awareness palpitations syncope no bleeding complications anticoagulant he has no edema shortness of breath or chest pain.  Recent labs with his PCP office and his recent INR reviewed with the patient during the visit as well as his last device download. Past Medical History:  Diagnosis Date  . 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 CAD, multiple vessel 07/20/2015  . Cardiomyopathy (HCC) 07/20/2015  . Diabetes mellitus with stage 3 chronic kidney disease (HCC) 07/20/2015  . Essential hypertension 07/20/2015  . GERD without esophagitis 07/20/2015  . Hepatitis A   . History of pulmonary embolism 07/20/2015  . Hypertensive heart disease without heart failure 06/17/2014  . Mixed hyperlipidemia 07/20/2015  . Old myocardial infarction 07/20/2015  . Paroxysmal atrial fibrillation (HCC) 05/31/2015  . Stage 3 chronic kidney disease (HCC) 07/20/2015    Past Surgical History:  Procedure Laterality Date  . APPENDECTOMY    . CHOLECYSTECTOMY    . CORONARY ARTERY BYPASS GRAFT    . CYSTOSCOPY    . INSERT / REPLACE / REMOVE PACEMAKER     St. Jude  . PROSTATECTOMY      Current Medications: No outpatient medications have been marked as taking for the 03/15/18 encounter (Appointment) with Baldo Daub, MD.     Allergies:   Ezetimibe; Fenofibrate micronized; Fish  oil; Niacin; Simvastatin; and Tetanus toxoid, adsorbed   Social History   Socioeconomic History  . Marital status: Married    Spouse name: Not on file  . Number of children: Not on file  . Years of education: Not on file  . Highest education level: Not on file  Occupational History  . Not on file  Social Needs  . Financial resource strain: Not on file  . Food insecurity:    Worry: Not on file    Inability: Not on  file  . Transportation needs:    Medical: Not on file    Non-medical: Not on file  Tobacco Use  . Smoking status: Former Games developer  . Smokeless tobacco: Never Used  Substance and Sexual Activity  . Alcohol use: Not Currently  . Drug use: Never  . Sexual activity: Not on file  Lifestyle  . Physical activity:    Days per week: Not on file    Minutes per session: Not on file  . Stress: Not on file  Relationships  . Social connections:    Talks on phone: Not on file    Gets together: Not on file    Attends religious service: Not on file    Active member of club or organization: Not on file    Attends meetings of clubs or organizations: Not on file    Relationship status: Not on file  Other Topics Concern  . Not on file  Social History Narrative  . Not on file     Family History: The patient's family history includes Cancer in his father; Heart Problems in his brother and mother; Parkinson's disease in his brother; Stroke in his mother. ROS:   Please see the history of present illness.    All other systems reviewed and are negative.  EKGs/Labs/Other Studies Reviewed:    The following studies were reviewed today:  Recent Labs:   11/07/2017 hemoglobin A1c 6.1 CMP showed BUN 30 creatinine 1.13 bilirubin 1.5 GFR 57 cc cholesterol total 173 LDL 115 HDL 48.  His CBC showed mild anemia hemoglobin 13.4  INR 2.4 No results found for requested labs within last 8760 hours.  Recent Lipid Panel No results found for: CHOL, TRIG, HDL, CHOLHDL, VLDL, LDLCALC, LDLDIRECT  Physical Exam:    VS:  There were no vitals taken for this visit.    Wt Readings from Last 3 Encounters:  11/05/17 137 lb (62.1 kg)  08/20/17 137 lb 3.2 oz (62.2 kg)     GEN:  Well nourished, well developed in no acute distress HEENT: Normal NECK: No JVD; No carotid bruits LYMPHATICS: No lymphadenopathy CARDIAC: RRR, no murmurs, rubs, gallops RESPIRATORY:  Clear to auscultation without rales, wheezing or rhonchi    ABDOMEN: Soft, non-tender, non-distended MUSCULOSKELETAL:  No edema; No deformity  SKIN: Warm and dry NEUROLOGIC:  Alert and oriented x 3 PSYCHIATRIC:  Normal affect    Signed, [name], MD  03/15/2018 7:52 AM    [facility]

## 2018-03-19 ENCOUNTER — Ambulatory Visit (INDEPENDENT_AMBULATORY_CARE_PROVIDER_SITE_OTHER): Payer: Medicare Other | Admitting: *Deleted

## 2018-03-19 DIAGNOSIS — I480 Paroxysmal atrial fibrillation: Secondary | ICD-10-CM

## 2018-03-19 DIAGNOSIS — Z5181 Encounter for therapeutic drug level monitoring: Secondary | ICD-10-CM

## 2018-03-19 LAB — POCT INR: INR: 2.9 (ref 2.0–3.0)

## 2018-03-19 NOTE — Patient Instructions (Signed)
Description   Continue taking 1 tablet daily. Recheck INR in 2 weeks. Please call our office with any medication changes or concerns [phone]. Be sure to keep your weekly intake of "greens" consistent.

## 2018-04-08 ENCOUNTER — Telehealth: Payer: Self-pay

## 2018-04-08 NOTE — Telephone Encounter (Signed)

## 2018-04-09 ENCOUNTER — Other Ambulatory Visit: Payer: Self-pay

## 2018-04-09 ENCOUNTER — Ambulatory Visit (INDEPENDENT_AMBULATORY_CARE_PROVIDER_SITE_OTHER): Payer: Medicare Other | Admitting: *Deleted

## 2018-04-09 DIAGNOSIS — I480 Paroxysmal atrial fibrillation: Secondary | ICD-10-CM | POA: Diagnosis not present

## 2018-04-09 LAB — POCT INR: INR: 4.1 — AB (ref 2.0–3.0)

## 2018-04-19 ENCOUNTER — Telehealth: Payer: Self-pay | Admitting: *Deleted

## 2018-04-19 NOTE — Telephone Encounter (Signed)
DEVICE INFORMATION [facility] [address]  S#     [id number]

## 2018-04-22 ENCOUNTER — Telehealth: Payer: Self-pay

## 2018-04-22 NOTE — Telephone Encounter (Signed)
Patient device is not remote capable.

## 2018-04-22 NOTE — Telephone Encounter (Signed)

## 2018-04-23 ENCOUNTER — Other Ambulatory Visit: Payer: Self-pay

## 2018-04-23 ENCOUNTER — Ambulatory Visit (INDEPENDENT_AMBULATORY_CARE_PROVIDER_SITE_OTHER): Payer: Medicare Other | Admitting: *Deleted

## 2018-04-23 DIAGNOSIS — I480 Paroxysmal atrial fibrillation: Secondary | ICD-10-CM

## 2018-04-23 LAB — POCT INR: INR: 3.1 — AB (ref 2.0–3.0)

## 2018-05-01 NOTE — Telephone Encounter (Signed)
Called pt to discuss OV. Pt does not do remotes and understands office is not seeing non-emergent pts in the office at this time d/t COVID-19. Pt denies any issues w/ PPM or heart. Pt rescheduled for yearly OV in [place], in August. Patient verbalized understanding and agreeable to plan.

## 2018-05-06 ENCOUNTER — Encounter: Payer: Medicare Other | Admitting: Cardiology

## 2018-05-06 ENCOUNTER — Telehealth: Payer: Self-pay

## 2018-05-06 ENCOUNTER — Telehealth: Payer: Self-pay | Admitting: *Deleted

## 2018-05-06 NOTE — Telephone Encounter (Signed)

## 2018-05-06 NOTE — Telephone Encounter (Signed)
lmom 

## 2018-05-07 ENCOUNTER — Other Ambulatory Visit: Payer: Self-pay

## 2018-05-07 ENCOUNTER — Ambulatory Visit (INDEPENDENT_AMBULATORY_CARE_PROVIDER_SITE_OTHER): Payer: Medicare Other | Admitting: *Deleted

## 2018-05-07 DIAGNOSIS — Z5181 Encounter for therapeutic drug level monitoring: Secondary | ICD-10-CM

## 2018-05-07 DIAGNOSIS — I480 Paroxysmal atrial fibrillation: Secondary | ICD-10-CM

## 2018-05-07 LAB — POCT INR: INR: 2.2 (ref 2.0–3.0)

## 2018-05-07 NOTE — Patient Instructions (Signed)
Description   Spoke with pt and instructed pt to take 1 pill today then continue taking 1 pill everyday except 1/2 pill on Tuesdays and Saturdays. Recheck INR in 2 weeks. Please call our office with any medication changes or concerns [phone]. Be sure to keep your weekly intake of "greens" consistent.

## 2018-05-20 ENCOUNTER — Telehealth: Payer: Self-pay

## 2018-05-20 NOTE — Telephone Encounter (Signed)

## 2018-05-21 ENCOUNTER — Other Ambulatory Visit: Payer: Self-pay

## 2018-05-21 ENCOUNTER — Ambulatory Visit (INDEPENDENT_AMBULATORY_CARE_PROVIDER_SITE_OTHER): Payer: Medicare Other

## 2018-05-21 DIAGNOSIS — Z7901 Long term (current) use of anticoagulants: Secondary | ICD-10-CM | POA: Diagnosis not present

## 2018-05-21 DIAGNOSIS — I480 Paroxysmal atrial fibrillation: Secondary | ICD-10-CM

## 2018-05-21 LAB — POCT INR: INR: 2.8 (ref 2.0–3.0)

## 2018-05-21 NOTE — Patient Instructions (Signed)
Description   Spoke with pt and instructed pt to continue on same dosage 1 pill everyday except 1/2 pill on Saturdays. Recheck INR in 4 weeks. Please call our office with any medication changes or concerns [phone]. Be sure to keep your weekly intake of "greens" consistent.

## 2018-06-13 ENCOUNTER — Telehealth: Payer: Self-pay

## 2018-06-13 NOTE — Telephone Encounter (Signed)

## 2018-06-18 ENCOUNTER — Ambulatory Visit (INDEPENDENT_AMBULATORY_CARE_PROVIDER_SITE_OTHER): Payer: Medicare Other | Admitting: *Deleted

## 2018-06-18 ENCOUNTER — Other Ambulatory Visit: Payer: Self-pay

## 2018-06-18 DIAGNOSIS — Z5181 Encounter for therapeutic drug level monitoring: Secondary | ICD-10-CM | POA: Diagnosis not present

## 2018-06-18 DIAGNOSIS — I480 Paroxysmal atrial fibrillation: Secondary | ICD-10-CM

## 2018-06-18 LAB — POCT INR: INR: 2.5 (ref 2.0–3.0)

## 2018-06-18 NOTE — Patient Instructions (Signed)
Description   Continue on same dosage 1 pill everyday except 1/2 pill on Saturdays. Recheck INR in 4 weeks. Please call our office with any medication changes or concerns [phone]. Be sure to keep your weekly intake of "greens" consistent.

## 2018-07-04 ENCOUNTER — Other Ambulatory Visit: Payer: Self-pay | Admitting: Cardiology

## 2018-07-04 DIAGNOSIS — I10 Essential (primary) hypertension: Secondary | ICD-10-CM

## 2018-07-04 DIAGNOSIS — I442 Atrioventricular block, complete: Secondary | ICD-10-CM

## 2018-07-04 DIAGNOSIS — I951 Orthostatic hypotension: Secondary | ICD-10-CM

## 2018-07-04 DIAGNOSIS — Z950 Presence of cardiac pacemaker: Secondary | ICD-10-CM

## 2018-07-04 DIAGNOSIS — Z7901 Long term (current) use of anticoagulants: Secondary | ICD-10-CM

## 2018-07-04 DIAGNOSIS — I25119 Atherosclerotic heart disease of native coronary artery with unspecified angina pectoris: Secondary | ICD-10-CM

## 2018-07-04 DIAGNOSIS — E782 Mixed hyperlipidemia: Secondary | ICD-10-CM

## 2018-07-04 NOTE — Telephone Encounter (Signed)
pls refill, tx 

## 2018-07-09 ENCOUNTER — Telehealth: Payer: Self-pay

## 2018-07-09 NOTE — Telephone Encounter (Signed)

## 2018-07-16 ENCOUNTER — Ambulatory Visit (INDEPENDENT_AMBULATORY_CARE_PROVIDER_SITE_OTHER): Payer: Medicare Other | Admitting: *Deleted

## 2018-07-16 ENCOUNTER — Other Ambulatory Visit: Payer: Self-pay

## 2018-07-16 DIAGNOSIS — I480 Paroxysmal atrial fibrillation: Secondary | ICD-10-CM | POA: Diagnosis not present

## 2018-07-16 LAB — POCT INR: INR: 2.6 (ref 2.0–3.0)

## 2018-07-16 NOTE — Patient Instructions (Signed)
Description   Continue on same dosage 1 pill everyday except 1/2 pill on Saturdays. Recheck INR in 5 weeks. Please call our office with any medication changes or concerns [phone]. Be sure to keep your weekly intake of "greens" consistent.

## 2018-08-19 ENCOUNTER — Encounter: Payer: Self-pay | Admitting: Cardiology

## 2018-08-19 ENCOUNTER — Ambulatory Visit (INDEPENDENT_AMBULATORY_CARE_PROVIDER_SITE_OTHER): Payer: Medicare Other | Admitting: Cardiology

## 2018-08-19 VITALS — BP 144/72 | HR 85 | Ht 67.0 in | Wt 136.0 lb

## 2018-08-19 DIAGNOSIS — I25119 Atherosclerotic heart disease of native coronary artery with unspecified angina pectoris: Secondary | ICD-10-CM

## 2018-08-19 DIAGNOSIS — I442 Atrioventricular block, complete: Secondary | ICD-10-CM | POA: Diagnosis not present

## 2018-08-19 NOTE — Progress Notes (Signed)
Electrophysiology Office Note   Date:  08/19/2018   ID:  Greg Oconnor, [place] 12-23-1925, MRN [id number]  PCP:  Raina Mina., MD  Cardiologist:  Bettina Gavia Primary Electrophysiologist:  Terrianna Holsclaw Meredith Leeds, MD    No chief complaint on file.    History of Present Illness: Greg Oconnor is a 85 y.o. male who is being seen today for the evaluation of cardiomyopathy at the request of [name]. Presenting today for electrophysiology evaluation.  Has a history of coronary artery disease,, hypertension, hyperlipidemia, Saint Jude pacemaker for heart block.  He presents today for initial visit for device follow-up.  Today, denies symptoms of palpitations, chest pain, shortness of breath, orthopnea, PND, lower extremity edema, claudication, dizziness, presyncope, syncope, bleeding, or neurologic sequela. The patient is tolerating medications without difficulties.    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 07/19/2015   CAD, multiple vessel 07/20/2015   Cardiomyopathy ([place]) 07/20/2015   Diabetes mellitus with stage 3 chronic kidney disease ([place]) 07/20/2015   Essential hypertension 07/20/2015   GERD without esophagitis 07/20/2015   Hepatitis A    History of pulmonary embolism 07/20/2015   Hypertensive heart disease without heart failure 06/17/2014   Mixed hyperlipidemia 07/20/2015   Old myocardial infarction 07/20/2015   Paroxysmal atrial fibrillation ([place]) 05/31/2015   Stage 3 chronic kidney disease ([place]) 07/20/2015   Past Surgical History:  Procedure Laterality Date   APPENDECTOMY     CHOLECYSTECTOMY     CORONARY ARTERY BYPASS GRAFT     CYSTOSCOPY     INSERT / REPLACE / REMOVE PACEMAKER     St. Jude   PROSTATECTOMY       Current Outpatient Medications  Medication Sig Dispense Refill   cetirizine (ZYRTEC) 10 MG tablet Take 10 mg by mouth daily as needed.      cholestyramine (QUESTRAN) 4 GM/DOSE  powder TAKE AS DIRECTED.     folic acid (FOLVITE) 010 MCG tablet Take 800 mcg by mouth daily.      lisinopril (ZESTRIL) 20 MG tablet Take 20 mg by mouth daily.     nitroGLYCERIN (NITROSTAT) 0.4 MG SL tablet Place 1 tablet under the tongue as needed for chest pain.     pantoprazole (PROTONIX) 40 MG tablet Take 1 tablet by mouth daily.     vitamin B-12 (CYANOCOBALAMIN) 500 MCG tablet Take 500 mcg by mouth daily.     warfarin (COUMADIN) 5 MG tablet TAKE AS DIRECTED BY  THE  COUMADIN  CLINIC 90 tablet 0   No current facility-administered medications for this visit.     Allergies:   Ezetimibe; Fenofibrate micronized; Fish oil; Niacin; Simvastatin; and Tetanus toxoid, adsorbed   Social History:  The patient  reports that he has quit smoking. He has never used smokeless tobacco. He reports previous alcohol use. He reports that he does not use drugs.   Family History:  The patient's family history includes Cancer in his father; Heart Problems in his brother and mother; Parkinson's disease in his brother; Stroke in his mother.    ROS:  Please see the history of present illness.   Otherwise, review of systems is positive for none.   All other systems are reviewed and negative.   PHYSICAL EXAM: VS:  BP (!) 144/72    Pulse 85    Ht 5\' 7"  (1.702 m)    Wt 136 lb (61.7 kg)    BMI 21.30 kg/m  , BMI  Body mass index is 21.3 kg/m. GEN: Well nourished, well developed, in no acute distress  HEENT: normal  Neck: no JVD, carotid bruits, or masses Cardiac: RRR; no murmurs, rubs, or gallops,no edema  Respiratory:  clear to auscultation bilaterally, normal work of breathing GI: soft, nontender, nondistended, + BS MS: no deformity or atrophy  Skin: warm and dry, device site well healed Neuro:  Strength and sensation are intact Psych: euthymic mood, full affect  EKG:  EKG is ordered today. Personal review of the ekg ordered shows atrial sensed, ventricular paced, PVCs  Personal review of the device  interrogation today. Results in [facility]    Recent Labs: No results found for requested labs within last 8760 hours.    Lipid Panel  No results found for: CHOL, TRIG, HDL, CHOLHDL, VLDL, LDLCALC, LDLDIRECT   Wt Readings from Last 3 Encounters:  08/19/18 136 lb (61.7 kg)  03/15/18 140 lb 9.6 oz (63.8 kg)  11/05/17 137 lb (62.1 kg)      Other studies Reviewed: Additional studies/ records that were reviewed today include: TTE 04/24/2017 Review of the above records today demonstrates:  Ejection fraction 30%.  Left atrial enlargement.  No significant aortic stenosis.  Mild to moderate mitral regurgitation.   ASSESSMENT AND PLAN:  1.  Complete heart block: [facility].  Atrial threshold is elevated though he rarely atrial paces.  Due to his battery status, Arel Tippen plan for monthly checks.  2.  Coronary artery disease: No current chest pain.  Plan per primary cardiology.  3.  Hypertension: Elevated here but is usually well controlled  4.  Paroxysmal atrial fibrillation: Currently on warfarin.  This patients CHA2DS2-VASc Score and unadjusted Ischemic Stroke Rate (% per year) is equal to 4.8 % stroke rate/year from a score of 4  Above score calculated as 1 point each if present [CHF, HTN, DM, Vascular=MI/PAD/Aortic Plaque, Age if 65-74, or Male] Above score calculated as 2 points each if present [Age > 75, or Stroke/TIA/TE]    Current medicines are reviewed at length with the patient today.   The patient does not have concerns regarding his medicines.  The following changes were made today: None  Labs/ tests ordered today include:  Orders Placed This Encounter  Procedures   EKG 12-Lead     Disposition:   FU with Kelvin Burpee 6 months  Signed, Clovia Reine Jorja LoaMartin Robbyn Hodkinson, MD  08/19/2018 11:46 AM     [facility] [address] [place] [address] [phone] (office) [phone] (fax)

## 2018-08-19 NOTE — Patient Instructions (Addendum)
Medication Instructions:  Your physician recommends that you continue on your current medications as directed. Please refer to the Current Medication list given to you today.  * If you need a refill on your cardiac medications before your next appointment, please call your pharmacy.   Labwork: None ordered  Testing/Procedures: None ordered  Follow-Up: Your physician recommends that you schedule a follow-up appointment in: 1 months with device for a pacemaker battery check  Your physician wants you to follow-up in: 6 months with Dr. Curt Bears.  You will receive a reminder letter in the mail two months in advance. If you don't receive a letter, please call our office to schedule the follow-up appointment.   Thank you for choosing [facility]!!   Trinidad Curet, RN [phone]

## 2018-08-20 ENCOUNTER — Ambulatory Visit (INDEPENDENT_AMBULATORY_CARE_PROVIDER_SITE_OTHER): Payer: Medicare Other | Admitting: *Deleted

## 2018-08-20 ENCOUNTER — Other Ambulatory Visit: Payer: Self-pay

## 2018-08-20 DIAGNOSIS — I480 Paroxysmal atrial fibrillation: Secondary | ICD-10-CM

## 2018-08-20 LAB — POCT INR: INR: 3.3 — AB (ref 2.0–3.0)

## 2018-08-20 NOTE — Patient Instructions (Signed)
Description   Today and tomorrow take only 1/2 pill, then Continue on same dosage 1 pill everyday except 1/2 pill on Saturdays. Recheck INR in 3 weeks. Please call our office with any medication changes or concerns [phone]. Be sure to keep your weekly intake of "greens" consistent.

## 2018-08-29 ENCOUNTER — Telehealth: Payer: Self-pay | Admitting: Cardiology

## 2018-08-29 NOTE — Telephone Encounter (Signed)
States he was supposed to have someone call him about getting his battery checked in his pacemaker

## 2018-09-03 NOTE — Telephone Encounter (Signed)
Pt scheduled for battery check, in [place] office, on 9/14. Patient verbalized understanding and agreeable to plan.

## 2018-09-10 ENCOUNTER — Other Ambulatory Visit: Payer: Self-pay

## 2018-09-10 ENCOUNTER — Ambulatory Visit (INDEPENDENT_AMBULATORY_CARE_PROVIDER_SITE_OTHER): Payer: Medicare Other | Admitting: *Deleted

## 2018-09-10 DIAGNOSIS — Z5181 Encounter for therapeutic drug level monitoring: Secondary | ICD-10-CM

## 2018-09-10 DIAGNOSIS — I480 Paroxysmal atrial fibrillation: Secondary | ICD-10-CM | POA: Diagnosis not present

## 2018-09-10 LAB — POCT INR: INR: 2.3 (ref 2.0–3.0)

## 2018-09-10 NOTE — Patient Instructions (Signed)
Description   Today 1.5 tablets, then Continue on same dosage 1 pill everyday except 1/2 pill on Saturdays. Recheck INR in 3 weeks. Please call our office with any medication changes or concerns [phone]. Be sure to keep your weekly intake of "greens" consistent.

## 2018-09-23 ENCOUNTER — Ambulatory Visit (INDEPENDENT_AMBULATORY_CARE_PROVIDER_SITE_OTHER): Payer: Medicare Other | Admitting: Cardiology

## 2018-09-23 ENCOUNTER — Encounter: Payer: Self-pay | Admitting: Cardiology

## 2018-09-23 ENCOUNTER — Other Ambulatory Visit: Payer: Self-pay

## 2018-09-23 DIAGNOSIS — I442 Atrioventricular block, complete: Secondary | ICD-10-CM

## 2018-09-23 NOTE — Progress Notes (Signed)
Pacemaker battery check.  Pacemaker is at [facility].  We will schedule for generator change with possible atrial lead revision.  Allegra Lai, MD

## 2018-09-23 NOTE — Patient Instructions (Signed)
Medication Instructions:  Your physician recommends that you continue on your current medications as directed. Please refer to the Current Medication list given to you today.     * If you need a refill on your cardiac medications before your next appointment, please call your pharmacy. *   Labwork: Your physician recommends that you return for pre procedure lab work on: _____________  * Will notify you of abnormal results, otherwise continue current treatment plan.*   Testing/Procedures: Your physician has recommended that you have a pacemaker inserted. A pacemaker is a small device that is placed under the skin of your chest or abdomen to help control abnormal heart rhythms. This device uses electrical pulses to prompt the heart to beat at a normal rate. Pacemakers are used to treat heart rhythms that are too slow. Wire (leads) are attached to the pacemaker that goes into the chambers of you heart. This is done in the hospital and usually requires and overnight stay. Please follow the instructions below, located under the special instructions section.   Follow-Up: Your physician recommends that you schedule a wound check appointment 10-14 days, after your procedure on _________, with the device clinic in [facility] [place], [address]).  Your physician recommends that you schedule a follow up appointment in 91 days, after your procedure on __________, with Dr. [name] in [place].  * Please note that any paperwork needing to be filled out by the provider will need to be addressed at the front desk prior to seeing the provider.  Please note that any FMLA, disability or other documents regarding health condition is subject to a $25.00 charge that must be received prior to completion of paperwork in the form of a money order or check. *  Thank you for choosing [facility]!!   Dory Horn, RN [phone]   Any Other Special Instructions Will Be Listed Below (If Applicable).    Implantable Device Instructions  You are scheduled for: Pacemaker implant on __________ with Dr. [name].  1.   Pre procedure testing-             A.  LAB WORK--- On ____________  You do not need to be fasting.               B. COVID TEST-- On ___________ @ _________ Bonita Quin will go to [facility] hospital ([address] [place]) for your Covid testing.   This is a drive thru test site.  There will be multiple testing areas.  Be sure to share with the first checkpoint that you are there for pre-procedure/surgery testing. This will put you into the right (yellow) lane that leads to the PAT testing team.   Stay in your car and the nurse team will come to your car to test you.  After you are tested please go home and self quarantine until the day of your procedure.    2. On the day of your procedure ___________ you will go to [facility] hospital [phone]) at _________.  You will go to the main entrance [address] [employer]) and enter where the [employer] parking staff are.  You will check in at ADMITTING.  You may have one support person come in to the hospital with you.  They will be asked to wait in the waiting room.   3.   Do not eat or drink after midnight prior to your procedure.   4.   On the morning of your procedure  do NOT take any medication.  5.  The night before your procedure and the morning of your procedure scrub your neck/chest with surgical scrub.  An instruction letter is included with this letter.    5.  Plan for an overnight stay.  If you use your phone frequently bring your phone charger.  When you are discharged you will need someone to drive you home.   6.  You will follow up with the [facility] Device clinic 10-14 days after your procedure. You will follow up with Dr. [name] 91 days after your procedure.  These appointments will be made for you.   * If you have ANY questions after you get home, please call the office [phone] and ask for Brok Stocking RN or send a  MyChart message.     Pacemaker Implantation, Adult Pacemaker implantation is a procedure to place a pacemaker inside your chest. A pacemaker is a small computer that sends electrical signals to the heart and helps your heart beat normally. A pacemaker also stores information about your heart rhythms. You may need pacemaker implantation if you:  Have a slow heartbeat (bradycardia).  Faint (syncope).  Have shortness of breath (dyspnea) due to heart problems.  The pacemaker attaches to your heart through a wire, called a lead. Sometimes just one lead is needed. Other times, there will be two leads. There are two types of pacemakers:  Transvenous pacemaker. This type is placed under the skin or muscle of your chest. The lead goes through a vein in the chest area to reach the inside of the heart.  Epicardial pacemaker. This type is placed under the skin or muscle of your chest or belly. The lead goes through your chest to the outside of the heart.  Tell a health care provider about:  Any allergies you have.  All medicines you are taking, including vitamins, herbs, eye drops, creams, and over-the-counter medicines.  Any problems you or family members have had with anesthetic medicines.  Any blood or bone disorders you have.  Any surgeries you have had.  Any medical conditions you have.  Whether you are pregnant or may be pregnant. What are the risks? Generally, this is a safe procedure. However, problems may occur, including:  Infection.  Bleeding.  Failure of the pacemaker or the lead.  Collapse of a lung or bleeding into a lung.  Blood clot inside a blood vessel with a lead.  Damage to the heart.  Infection inside the heart (endocarditis).  Allergic reactions to medicines.  What happens before the procedure? Staying hydrated Follow instructions from your health care provider about hydration, which may include:  Up to 2 hours before the procedure - you may  continue to drink clear liquids, such as water, clear fruit juice, black coffee, and plain tea.  Eating and drinking restrictions Follow instructions from your health care provider about eating and drinking, which may include:  8 hours before the procedure - stop eating heavy meals or foods such as meat, fried foods, or fatty foods.  6 hours before the procedure - stop eating light meals or foods, such as toast or cereal.  6 hours before the procedure - stop drinking milk or drinks that contain milk.  2 hours before the procedure - stop drinking clear liquids.  Medicines  Ask your health care provider about: ? Changing or stopping your regular medicines. This is especially important if you are taking diabetes medicines or blood thinners. ? Taking medicines such as aspirin  and ibuprofen. These medicines can thin your blood. Do not take these medicines before your procedure if your health care provider instructs you not to.  You may be given antibiotic medicine to help prevent infection. General instructions  You will have a heart evaluation. This may include an electrocardiogram (ECG), chest X-ray, and heart imaging (echocardiogram,  or echo) tests.  You will have blood tests.  Do not use any products that contain nicotine or tobacco, such as cigarettes and e-cigarettes. If you need help quitting, ask your health care provider.  Plan to have someone take you home from the hospital or clinic.  If you will be going home right after the procedure, plan to have someone with you for 24 hours.  Ask your health care provider how your surgical site will be marked or identified. What happens during the procedure?  To reduce your risk of infection: ? Your health care team will wash or sanitize their hands. ? Your skin will be washed with soap. ? Hair may be removed from the surgical area.  An IV tube will be inserted into one of your veins.  You will be given one or more of the  following: ? A medicine to help you relax (sedative). ? A medicine to numb the area (local anesthetic). ? A medicine to make you fall asleep (general anesthetic).  If you are getting a transvenous pacemaker: ? An incision will be made in your upper chest. ? A pocket will be made for the pacemaker. It may be placed under the skin or between layers of muscle. ? The lead will be inserted into a blood vessel that returns to the heart. ? While X-rays are taken by an imaging machine (fluoroscopy), the lead will be advanced through the vein to the inside of your heart. ? The other end of the lead will be tunneled under the skin and attached to the pacemaker.  If you are getting an epicardial pacemaker: ? An incision will be made near your ribs or breastbone (sternum) for the lead. ? The lead will be attached to the outside of your heart. ? Another incision will be made in your chest or upper belly to create a pocket for the pacemaker. ? The free end of the lead will be tunneled under the skin and attached to the pacemaker.  The transvenous or epicardial pacemaker will be tested. Imaging studies may be done to check the lead position.  The incisions will be closed with stitches (sutures), adhesive strips, or skin glue.  Bandages (dressing) will be placed over the incisions. The procedure may vary among health care providers and hospitals. What happens after the procedure?  Your blood pressure, heart rate, breathing rate, and blood oxygen level will be monitored until the medicines you were given have worn off.  You will be given antibiotics and pain medicine.  ECG and chest x-rays will be done.  You will wear a continuous type of ECG (Holter monitor) to check your heart rhythm.  Your health care provider will program the pacemaker.  Do not drive for 24 hours if you received a sedative. This information is not intended to replace advice given to you by your health care provider. Make sure  you discuss any questions you have with your health care provider. Document Released: 12/16/2001 Document Revised: 07/16/2015 Document Reviewed: 06/09/2015 Elsevier Interactive Patient Education  2018 [employer].     Pacemaker Implantation, Adult, Care After This sheet gives you information about how to care  for yourself after your procedure. Your health care provider may also give you more specific instructions. If you have problems or questions, contact your health care provider. What can I expect after the procedure? After the procedure, it is common to have:  Mild pain.  Slight bruising.  Some swelling over the incision.  A slight bump over the skin where the device was placed. Sometimes, it is possible to feel the device under the skin. This is normal.  Follow these instructions at home: Medicines  Take over-the-counter and prescription medicines only as told by your health care provider.  If you were prescribed an antibiotic medicine, take it as told by your health care provider. Do not stop taking the antibiotic even if you start to feel better. Wound care  Do not remove the bandage on your chest until directed to do so by your health care provider.  After your bandage is removed, you may see pieces of tape called skin adhesive strips over the area where the cut was made (incision site). Let them fall off on their own.  Check the incision site every day to make sure it is not infected, bleeding, or starting to pull apart.  Do not use lotions or ointments near the incision site unless directed to do so.  Keep the incision area clean and dry for 2-3 days after the procedure or as directed by your health care provider. It takes several weeks for the incision site to completely heal.  Do not take baths, swim, or use a hot tub for 7-10 days or as otherwise directed by your health care provider. Activity  Do not drive or use heavy machinery while taking prescription pain  medicine.  Do not drive for 24 hours if you were given a medicine to help you relax (sedative).  Check with your health care provider before you start to drive or play sports.  Avoid sudden jerking, pulling, or chopping movements that pull your upper arm far away from your body. Avoid these movements for at least 6 weeks or as long as told by your health care provider.  Do not lift your upper arm above your shoulders for at least 6 weeks or as long as told by your health care provider. This means no tennis, golf, or swimming.  You may go back to work when your health care provider says it is okay. Pacemaker care  You may be shown how to transfer data from your pacemaker through the phone to your health care provider.  Always let all health care providers know about your pacemaker before you have any medical procedures or tests.  Wear a medical ID bracelet or necklace stating that you have a pacemaker. Carry a pacemaker ID card with you at all times.  Your pacemaker battery will last for 5-15 years. Routine checks by your health care provider will let the health care provider know when the battery is starting to run down. The pacemaker will need to be replaced when the battery starts to run down.  Do not use amateur Proofreader. Other electrical devices are safe to use, including power tools, lawn mowers, and speakers. If you are unsure of whether something is safe to use, ask your health care provider.  When using your cell phone, hold it to the ear opposite the pacemaker. Do not leave your cell phone in a pocket over the pacemaker.  Avoid places or objects that have a strong electric or magnetic field, including: ?  Airport [employer] gates. When at the airport, let officials know that you have a pacemaker. ? Power plants. ? Large electrical generators. ? Radiofrequency transmission towers, such as cell phone and radio towers. General instructions  Weigh  yourself every day. If you suddenly gain weight, fluid may be building up in your body.  Keep all follow-up visits as told by your health care provider. This is important. Contact a health care provider if:  You gain weight suddenly.  Your legs or feet swell.  It feels like your heart is fluttering or skipping beats (heart palpitations).  You have chills or a fever.  You have more redness, swelling, or pain around your incisions.  You have more fluid or blood coming from your incisions.  Your incisions feel warm to the touch.  You have pus or a bad smell coming from your incisions. Get help right away if:  You have chest pain.  You have trouble breathing or are short of breath.  You become extremely tired.  You are light-headed or you faint. This information is not intended to replace advice given to you by your health care provider. Make sure you discuss any questions you have with your health care provider. Document Released: 07/15/2004 Document Revised: 10/08/2015 Document Reviewed: 10/08/2015 Elsevier Interactive Patient Education  2018 [employer]    Supplemental Discharge Instructions for  Pacemaker/Defibrillator Patients  ACTIVITY No heavy lifting or vigorous activity with your left/right arm for 6 to 8 weeks.  Do not raise your left/right arm above your head for one week.  Gradually raise your affected arm as drawn below.           __  NO DRIVING for     ; you may begin driving on     .  WOUND CARE - Keep the wound area clean and dry.  Do not get this area wet for one week. No showers for one week; you may shower on     . - The tape/steri-strips on your wound will fall off; do not pull them off.  No bandage is needed on the site.  DO  NOT apply any creams, oils, or ointments to the wound area. - If you notice any drainage or discharge from the wound, any swelling or bruising at the site, or you develop a fever > 101? F after you are discharged home, call  the office at once.  SPECIAL INSTRUCTIONS - You are still able to use cellular telephones; use the ear opposite the side where you have your pacemaker/defibrillator.  Avoid carrying your cellular phone near your device. - When traveling through airports, show security personnel your identification card to avoid being screened in the metal detectors.  Ask the security personnel to use the hand wand. - Avoid arc welding equipment, MRI testing (magnetic resonance imaging), TENS units (transcutaneous nerve stimulators).  Call the office for questions about other devices. - Avoid electrical appliances that are in poor condition or are not properly grounded. - Microwave ovens are safe to be near or to operate.  ADDITIONAL INFORMATION FOR DEFIBRILLATOR PATIENTS SHOULD YOUR DEVICE GO OFF: - If your device goes off ONCE and you feel fine afterward, notify the device clinic nurses. - If your device goes off ONCE and you do not feel well afterward, call 911. - If your device goes off TWICE, call 911. - If your device goes off THREE TIMES IN ONE DAY, call 911.  DO NOT DRIVE YOURSELF OR A FAMILY MEMBER WITH A  DEFIBRILLATOR TO THE HOSPITAL-CALL 911.

## 2018-09-24 ENCOUNTER — Telehealth: Payer: Self-pay | Admitting: Cardiology

## 2018-09-24 NOTE — Telephone Encounter (Signed)
Wants Oct 14 for his pacemaker

## 2018-09-25 NOTE — Telephone Encounter (Signed)
Called pt, informed that we cannot do 10/14. Offered pt 10/16. Pt will discuss w/ his dtr.   Aware I will call him tomorrow to follow up. Patient verbalized understanding and agreeable to plan.

## 2018-09-26 NOTE — Telephone Encounter (Signed)
Called pt who reports that he can do 10/16 for procedure. Pt aware I will arrange and call soon to go over instructions w/ him and his dtr. Patient verbalized understanding and agreeable to plan.

## 2018-10-01 ENCOUNTER — Other Ambulatory Visit: Payer: Self-pay

## 2018-10-01 ENCOUNTER — Ambulatory Visit (INDEPENDENT_AMBULATORY_CARE_PROVIDER_SITE_OTHER): Payer: Medicare Other | Admitting: Pharmacist Clinician (PhC)/ Clinical Pharmacy Specialist

## 2018-10-01 DIAGNOSIS — I480 Paroxysmal atrial fibrillation: Secondary | ICD-10-CM

## 2018-10-01 DIAGNOSIS — Z7901 Long term (current) use of anticoagulants: Secondary | ICD-10-CM

## 2018-10-01 LAB — POCT INR: INR: 3.4 — AB (ref 2.0–3.0)

## 2018-10-07 ENCOUNTER — Other Ambulatory Visit: Payer: Self-pay | Admitting: Cardiology

## 2018-10-07 NOTE — Telephone Encounter (Signed)
° °  Daughter Fraser Din calling to discuss pre procedure instructions

## 2018-10-08 NOTE — Telephone Encounter (Signed)
Reviewed instructions w/ dtr. Covid screening scheduled for 10/13. Wound check scheduled for 11/3. Dtr aware I will call pt also to go over instructions.  Spoke to patient also and reviewed instructions.

## 2018-10-13 DIAGNOSIS — Z789 Other specified health status: Secondary | ICD-10-CM

## 2018-10-13 HISTORY — DX: Other specified health status: Z78.9

## 2018-10-13 NOTE — Progress Notes (Signed)
Cardiology Office Note:    Date:  10/15/2018   ID:  Greg Oconnor, DOB 1926/01/07, MRN [id number]  PCP:  Raina Mina., MD  Cardiologist:  [name], MD    Referring MD: Raina Mina., MD    ASSESSMENT:    1. Coronary artery disease involving native coronary artery of native heart with angina pectoris ([place])   2. Hypertensive heart disease without heart failure   3. Orthostatic hypotension   4. Paroxysmal atrial fibrillation (HCC)   5. Pacemaker   6. Mixed hyperlipidemia   7. Statin intolerance    PLAN:    In order of problems listed above:  1. Stable CAD with remote bypass having no angina on current medical treatment will continue the same.  He is intolerant of statins and Zetia at this time with his comorbidities and age I would not put him on PCSK9 therapy. 2. Stable hypertension continue current treatment he adjust his ACE inhibitor to avoid symptomatic hypotension on days he works outdoors 3. Stable he said no severe symptoms and I reinforced the need to be careful straining in the bathroom or shifting posture 4. Stable no clinical recurrence 5. Elective generator replacement scheduled will follow in our device clinic 6. Poorly controlled intolerant of statins Zetia continue current resin binder Questran with a reasonable LDL on treatment   Next appointment: 6 months or sooner if he symptomatic   Medication Adjustments/Labs and Tests Ordered: Current medicines are reviewed at length with the patient today.  Concerns regarding medicines are outlined above.  No orders of the defined types were placed in this encounter.  No orders of the defined types were placed in this encounter.   Chief Complaint  Patient presents with  . Follow-up  . Coronary Artery Disease  . Hypertension  . Hypotension  . Atrial Fibrillation  . Anticoagulation  . Hyperlipidemia    History of Present Illness:    Greg Oconnor is a 85 y.o. male with a hx of CAD CABG 2005  hypertension hypotension hyperlipidemia pacemaker for heart block St Jude and chronic anticoagulation last seen 03/15/2018. Compliance with diet, lifestyle and medications: Yes  He is due to have his INR checked today will be performed prior to leaving the office. He is scheduled for elective generator replacement in the next few weeks Clinically he has done well he is vigorous active working outside but still has difficulty when he strains in the bathroom or does a lot of shifting posture being lightheaded with his orthostatic hypotension but he has not fallen or had a severe episode.  Despite a history of coronary artery disease he has had no chest pain no palpitation no pacemaker awareness no bleeding from warfarin and no neurologic event.  Overall he is pleased with the quality of his life.  Fortunately has family will watch over him on a frequent basis   12d ago (10/01/18) 73moago (09/10/18) 171mogo (08/20/18)   INR 2.0 - 3.0 3.4Abnormal   2.3  3.3    Past Medical History:  Diagnosis Date  . 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 CAD, multiple vessel 07/20/2015  . Cardiomyopathy ([facility]/11/2015  . Diabetes mellitus with stage 3 chronic kidney disease ([facility]/11/2015  . Essential hypertension 07/20/2015  . GERD without esophagitis 07/20/2015  . Hepatitis A   . History of pulmonary embolism 07/20/2015  . Hypertensive heart disease without heart failure 06/17/2014  . Mixed hyperlipidemia 07/20/2015  . Old  myocardial infarction 07/20/2015  . Paroxysmal atrial fibrillation ([place]) 05/31/2015  . Stage 3 chronic kidney disease 07/20/2015    Past Surgical History:  Procedure Laterality Date  . APPENDECTOMY    . CHOLECYSTECTOMY    . CORONARY ARTERY BYPASS GRAFT    . CYSTOSCOPY    . INSERT / REPLACE / REMOVE PACEMAKER     St. Jude  . PROSTATECTOMY      Current Medications: Current Meds  Medication Sig  . cetirizine (ZYRTEC) 10 MG tablet Take 10 mg by mouth  daily as needed.   . cholestyramine (QUESTRAN) 4 GM/DOSE powder TAKE AS DIRECTED.  . folic acid (FOLVITE) 197 MCG tablet Take 800 mcg by mouth daily.   Marland Kitchen lisinopril (ZESTRIL) 20 MG tablet Take 20 mg by mouth daily.  . nitroGLYCERIN (NITROSTAT) 0.4 MG SL tablet Place 1 tablet under the tongue every 5 (five) minutes as needed for chest pain.   . pantoprazole (PROTONIX) 40 MG tablet Take 1 tablet by mouth daily.  . vitamin B-12 (CYANOCOBALAMIN) 500 MCG tablet Take 500 mcg by mouth daily.  Marland Kitchen warfarin (COUMADIN) 5 MG tablet TAKE AS DIRECTED BY  THE  COUMADIN  CLINIC     Allergies:   Ezetimibe; Fenofibrate micronized; Fish oil; Niacin; Simvastatin; and Tetanus toxoid, adsorbed   Social History   Socioeconomic History  . Marital status: Married    Spouse name: Not on file  . Number of children: Not on file  . Years of education: Not on file  . Highest education level: Not on file  Occupational History  . Not on file  Social Needs  . Financial resource strain: Not on file  . Food insecurity    Worry: Not on file    Inability: Not on file  . Transportation needs    Medical: Not on file    Non-medical: Not on file  Tobacco Use  . Smoking status: Former Smoker    Types: Cigarettes  . Smokeless tobacco: Never Used  Substance and Sexual Activity  . Alcohol use: Not Currently  . Drug use: Never  . Sexual activity: Not on file  Lifestyle  . Physical activity    Days per week: Not on file    Minutes per session: Not on file  . Stress: Not on file  Relationships  . Social Herbalist on phone: Not on file    Gets together: Not on file    Attends religious service: Not on file    Active member of club or organization: Not on file    Attends meetings of clubs or organizations: Not on file    Relationship status: Not on file  Other Topics Concern  . Not on file  Social History Narrative  . Not on file     Family History: The patient's family history includes Cancer in  his father; Heart Problems in his brother and mother; Parkinson's disease in his brother; Stroke in his mother. ROS:   Please see the history of present illness.    All other systems reviewed and are negative.  EKGs/Labs/Other Studies Reviewed:    The following studies were reviewed today:  EKG 08/21/2018 shows normal dual-chamber pacemaker function he is pacemaker dependent with occasional PVC Recent Labs: 05/21/2018: CMP normal except Alk Phos 129, K 4.2 Cr 0.66 Chol 159 HDL 38 LD: 110  Physical Exam:    VS:  BP (!) 144/80 (BP Location: Right Arm, Patient Position: Sitting, Cuff Size: Normal)   Pulse 96  Ht _0  (1.702 m)   Wt 137 lb 3.2 oz (62.2 kg)   SpO2 100%   BMI 21.49 kg/m     Wt Readings from Last 3 Encounters:  10/15/18 137 lb 3.2 oz (62.2 kg)  08/19/18 136 lb (61.7 kg)  03/15/18 140 lb 9.6 oz (63.8 kg)     GEN:  Well nourished, well developed in no acute distress HEENT: Normal NECK: No JVD; No carotid bruits LYMPHATICS: No lymphadenopathy CARDIAC: RRR, no murmurs, rubs, gallops RESPIRATORY:  Clear to auscultation without rales, wheezing or rhonchi  ABDOMEN: Soft, non-tender, non-distended MUSCULOSKELETAL:  No edema; No deformity  SKIN: Warm and dry NEUROLOGIC:  Alert and oriented x 3 PSYCHIATRIC:  Normal affect    Signed, [name], MD  10/15/2018 11:23 AM    [facility]

## 2018-10-15 ENCOUNTER — Ambulatory Visit (INDEPENDENT_AMBULATORY_CARE_PROVIDER_SITE_OTHER): Payer: Medicare Other | Admitting: Pharmacist

## 2018-10-15 ENCOUNTER — Ambulatory Visit (INDEPENDENT_AMBULATORY_CARE_PROVIDER_SITE_OTHER): Payer: Medicare Other | Admitting: Cardiology

## 2018-10-15 ENCOUNTER — Encounter: Payer: Self-pay | Admitting: Cardiology

## 2018-10-15 ENCOUNTER — Other Ambulatory Visit: Payer: Self-pay

## 2018-10-15 VITALS — BP 144/80 | HR 96 | Ht 67.0 in | Wt 137.2 lb

## 2018-10-15 DIAGNOSIS — E782 Mixed hyperlipidemia: Secondary | ICD-10-CM

## 2018-10-15 DIAGNOSIS — I480 Paroxysmal atrial fibrillation: Secondary | ICD-10-CM

## 2018-10-15 DIAGNOSIS — Z789 Other specified health status: Secondary | ICD-10-CM

## 2018-10-15 DIAGNOSIS — I951 Orthostatic hypotension: Secondary | ICD-10-CM | POA: Diagnosis not present

## 2018-10-15 DIAGNOSIS — I25119 Atherosclerotic heart disease of native coronary artery with unspecified angina pectoris: Secondary | ICD-10-CM | POA: Diagnosis not present

## 2018-10-15 DIAGNOSIS — Z950 Presence of cardiac pacemaker: Secondary | ICD-10-CM

## 2018-10-15 DIAGNOSIS — I119 Hypertensive heart disease without heart failure: Secondary | ICD-10-CM | POA: Diagnosis not present

## 2018-10-15 LAB — POCT INR: INR: 2.8 (ref 2.0–3.0)

## 2018-10-15 NOTE — Patient Instructions (Signed)
Continue with 1 tablet daily except 1/2 tablet each Saturday.  Repeat INR in 3 weeks

## 2018-10-15 NOTE — Patient Instructions (Signed)

## 2018-10-16 NOTE — Telephone Encounter (Signed)
Apologized to pt for not calling him.  Reviewed instructions with pt who verbalized understanding and agreeable to plan.

## 2018-10-22 ENCOUNTER — Other Ambulatory Visit (HOSPITAL_COMMUNITY)
Admission: RE | Admit: 2018-10-22 | Discharge: 2018-10-22 | Disposition: A | Discharge status: Home / Self Care | Payer: Medicare Other | Source: Ambulatory Visit | Attending: Cardiology | Admitting: Cardiology

## 2018-10-22 DIAGNOSIS — Z20828 Contact with and (suspected) exposure to other viral communicable diseases: Secondary | ICD-10-CM | POA: Diagnosis not present

## 2018-10-22 DIAGNOSIS — Z01812 Encounter for preprocedural laboratory examination: Secondary | ICD-10-CM | POA: Insufficient documentation

## 2018-10-23 LAB — NOVEL CORONAVIRUS, NAA (HOSP ORDER, SEND-OUT TO REF LAB; TAT 18-24 HRS): SARS-CoV-2, NAA: NOT DETECTED

## 2018-10-25 ENCOUNTER — Other Ambulatory Visit: Payer: Self-pay

## 2018-10-25 ENCOUNTER — Ambulatory Visit (HOSPITAL_COMMUNITY)
Admission: RE | Disposition: A | Discharge status: Home / Self Care | Payer: Self-pay | Source: Home / Self Care | Attending: Cardiology

## 2018-10-25 ENCOUNTER — Ambulatory Visit (HOSPITAL_COMMUNITY)
Admission: RE | Admit: 2018-10-25 | Discharge: 2018-10-25 | Disposition: A | Discharge status: Home / Self Care | Payer: Medicare Other | Attending: Cardiology | Admitting: Cardiology

## 2018-10-25 DIAGNOSIS — Z45010 Encounter for checking and testing of cardiac pacemaker pulse generator [battery]: Secondary | ICD-10-CM | POA: Diagnosis not present

## 2018-10-25 DIAGNOSIS — Z7901 Long term (current) use of anticoagulants: Secondary | ICD-10-CM | POA: Insufficient documentation

## 2018-10-25 DIAGNOSIS — I480 Paroxysmal atrial fibrillation: Secondary | ICD-10-CM | POA: Diagnosis not present

## 2018-10-25 DIAGNOSIS — I130 Hypertensive heart and chronic kidney disease with heart failure and stage 1 through stage 4 chronic kidney disease, or unspecified chronic kidney disease: Secondary | ICD-10-CM | POA: Diagnosis not present

## 2018-10-25 DIAGNOSIS — N183 Chronic kidney disease, stage 3 unspecified: Secondary | ICD-10-CM | POA: Insufficient documentation

## 2018-10-25 DIAGNOSIS — I429 Cardiomyopathy, unspecified: Secondary | ICD-10-CM | POA: Insufficient documentation

## 2018-10-25 DIAGNOSIS — I252 Old myocardial infarction: Secondary | ICD-10-CM | POA: Insufficient documentation

## 2018-10-25 DIAGNOSIS — E782 Mixed hyperlipidemia: Secondary | ICD-10-CM | POA: Insufficient documentation

## 2018-10-25 DIAGNOSIS — E1122 Type 2 diabetes mellitus with diabetic chronic kidney disease: Secondary | ICD-10-CM | POA: Diagnosis not present

## 2018-10-25 DIAGNOSIS — I2510 Atherosclerotic heart disease of native coronary artery without angina pectoris: Secondary | ICD-10-CM | POA: Insufficient documentation

## 2018-10-25 DIAGNOSIS — K219 Gastro-esophageal reflux disease without esophagitis: Secondary | ICD-10-CM | POA: Diagnosis not present

## 2018-10-25 DIAGNOSIS — I442 Atrioventricular block, complete: Secondary | ICD-10-CM | POA: Diagnosis not present

## 2018-10-25 DIAGNOSIS — Z87891 Personal history of nicotine dependence: Secondary | ICD-10-CM | POA: Insufficient documentation

## 2018-10-25 DIAGNOSIS — Z86711 Personal history of pulmonary embolism: Secondary | ICD-10-CM | POA: Insufficient documentation

## 2018-10-25 HISTORY — PX: PPM GENERATOR CHANGEOUT: EP1233

## 2018-10-25 LAB — CBC
HCT: 42.2 % (ref 39.0–52.0)
Hemoglobin: 13.7 g/dL (ref 13.0–17.0)
MCH: 31 pg (ref 26.0–34.0)
MCHC: 32.5 g/dL (ref 30.0–36.0)
MCV: 95.5 fL (ref 80.0–100.0)
Platelets: 197 10*3/uL (ref 150–400)
RBC: 4.42 MIL/uL (ref 4.22–5.81)
RDW: 13.2 % (ref 11.5–15.5)
WBC: 4.1 10*3/uL (ref 4.0–10.5)
nRBC: 0 % (ref 0.0–0.2)

## 2018-10-25 LAB — BASIC METABOLIC PANEL
Anion gap: 10 (ref 5–15)
BUN: 24 mg/dL — ABNORMAL HIGH (ref 8–23)
CO2: 24 mmol/L (ref 22–32)
Calcium: 9.4 mg/dL (ref 8.9–10.3)
Chloride: 108 mmol/L (ref 98–111)
Creatinine, Ser: 1.22 mg/dL (ref 0.61–1.24)
GFR calc Af Amer: >60 mL/min (ref >60)
GFR calc non Af Amer: 52 mL/min — ABNORMAL LOW (ref >60)
Glucose, Bld: 122 mg/dL — ABNORMAL HIGH (ref 70–99)
Potassium: 4.3 mmol/L (ref 3.5–5.1)
Sodium: 142 mmol/L (ref 135–145)

## 2018-10-25 LAB — PROTIME-INR
INR: 2.4 — ABNORMAL HIGH (ref 0.8–1.2)
Prothrombin Time: 25.5 seconds — ABNORMAL HIGH (ref 11.4–15.2)

## 2018-10-25 LAB — GLUCOSE, CAPILLARY: Glucose-Capillary: 127 mg/dL — ABNORMAL HIGH (ref 70–99)

## 2018-10-25 LAB — SURGICAL PCR SCREEN
MRSA, PCR: NEGATIVE
Staphylococcus aureus: NEGATIVE

## 2018-10-25 SURGERY — PPM GENERATOR CHANGEOUT

## 2018-10-25 MED ORDER — CEFAZOLIN SODIUM-DEXTROSE 2-4 GM/100ML-% IV SOLN
INTRAVENOUS | Status: AC
  Filled 2018-10-25: qty 100

## 2018-10-25 MED ORDER — CHLORHEXIDINE GLUCONATE 4 % EX LIQD: 60.0000 mL | Freq: Once | CUTANEOUS | Status: DC

## 2018-10-25 MED ORDER — CEFAZOLIN SODIUM-DEXTROSE 2-4 GM/100ML-% IV SOLN
2.0000 g | INTRAVENOUS | Status: AC
  Administered 2018-10-25: 2 g via INTRAVENOUS

## 2018-10-25 MED ORDER — ACETAMINOPHEN 325 MG PO TABS
325.0000 mg | ORAL_TABLET | ORAL | Status: DC | PRN
  Filled 2018-10-25: qty 2

## 2018-10-25 MED ORDER — LIDOCAINE HCL (PF) 1 % IJ SOLN
INTRAMUSCULAR | Status: DC | PRN
  Administered 2018-10-25: 50 mL

## 2018-10-25 MED ORDER — SODIUM CHLORIDE 0.9 % IV SOLN
80.0000 mg | INTRAVENOUS | Status: AC
  Administered 2018-10-25: 80 mg

## 2018-10-25 MED ORDER — ONDANSETRON HCL 4 MG/2ML IJ SOLN: 4.0000 mg | Freq: Four times a day (QID) | INTRAMUSCULAR | Status: DC | PRN

## 2018-10-25 MED ORDER — LIDOCAINE HCL 1 % IJ SOLN
INTRAMUSCULAR | Status: AC
  Filled 2018-10-25: qty 60

## 2018-10-25 MED ORDER — MUPIROCIN 2 % EX OINT
TOPICAL_OINTMENT | CUTANEOUS | Status: AC
  Administered 2018-10-25: 1 via NASAL
  Filled 2018-10-25: qty 22

## 2018-10-25 MED ORDER — SODIUM CHLORIDE 0.9 % IV SOLN
INTRAVENOUS | Status: AC
  Filled 2018-10-25: qty 2

## 2018-10-25 MED ORDER — SODIUM CHLORIDE 0.9 % IV SOLN
INTRAVENOUS | Status: DC
  Administered 2018-10-25: 11:00:00 via INTRAVENOUS

## 2018-10-25 SURGICAL SUPPLY — 6 items
CABLE SURGICAL S-101-97-12 (CABLE) ×3 IMPLANT
DEVICE DISSECT PLASMABLAD 3.0S (MISCELLANEOUS) ×1 IMPLANT
PACEMAKER ASSURITY DR-RF (Pacemaker) ×3 IMPLANT
PAD PRO RADIOLUCENT 2001M-C (PAD) ×3 IMPLANT
PLASMABLADE 3.0S (MISCELLANEOUS) ×3
TRAY PACEMAKER INSERTION (PACKS) ×3 IMPLANT

## 2018-10-25 NOTE — Discharge Instructions (Signed)
Post procedure care instructions °Keep incision clean and dry for 10 days. °No driving for 2 days.  °You can remove outer dressing tomorrow. °Leave steri-strips (little pieces of tape) on until seen in the office for wound check appointment. °Call the office ([phone]) for redness, drainage, swelling, or fever. ° ° °Pacemaker Battery Change, Care After °This sheet gives you information about how to care for yourself after your procedure. Your health care provider may also give you more specific instructions. If you have problems or questions, contact your health care provider. °What can I expect after the procedure? °After your procedure, it is common to have: °· Pain or soreness at the site where the pacemaker was inserted. °· Swelling at the site where the pacemaker was inserted. °Follow these instructions at home: °Incision care °· Keep the incision clean and dry. °? Do not take baths, swim, or use a hot tub until your health care provider approves. °? You may shower the day after your procedure, or as directed by your health care provider. °? Pat the area dry with a clean towel. Do not rub the area. This may cause bleeding. °· Follow instructions from your health care provider about how to take care of your incision. Make sure you: °? Wash your hands with soap and water before you change your bandage (dressing). If soap and water are not available, use hand sanitizer. °? Change your dressing as told by your health care provider. °? Leave stitches (sutures), skin glue, or adhesive strips in place. These skin closures may need to stay in place for 2 weeks or longer. If adhesive strip edges start to loosen and curl up, you may trim the loose edges. Do not remove adhesive strips completely unless your health care provider tells you to do that. °· Check your incision area every day for signs of infection. Check for: °? More redness, swelling, or pain. °? More fluid or blood. °? Warmth. °? Pus or a bad  smell. °Activity °· Do not lift anything that is heavier than 10 lb (4.5 kg) until your health care provider says it is okay to do so. °· For the first 2 weeks, or as long as told by your health care provider: °? Avoid lifting your left arm higher than your shoulder. °? Be gentle when you move your arms over your head. It is okay to raise your arm to comb your hair. °? Avoid strenuous exercise. °· Ask your health care provider when it is okay to: °? Resume your normal activities. °? Return to work or school. °? Resume sexual activity. °Eating and drinking °· Eat a heart-healthy diet. This should include plenty of fresh fruits and vegetables, whole grains, low-fat dairy products, and lean protein like chicken and fish. °· Limit alcohol intake to no more than 1 drink a day for non-pregnant women and 2 drinks a day for men. One drink equals 12 oz of beer, 5 oz of wine, or 1½ oz of hard liquor. °· Check ingredients and nutrition facts on packaged foods and beverages. Avoid the following types of food: °? Food that is high in salt (sodium). °? Food that is high in saturated fat, like full-fat dairy or red meat. °? Food that is high in trans fat, like fried food. °? Food and drinks that are high in sugar. °Lifestyle °· Do not use any products that contain nicotine or tobacco, such as cigarettes and e-cigarettes. If you need help quitting, ask your health care provider. °· Take   steps to manage and control your weight. °· Get regular exercise. Aim for 150 minutes of moderate-intensity exercise (such as walking or yoga) or 75 minutes of vigorous exercise (such as running or swimming) each week. °· Manage other health problems, such as diabetes or high blood pressure. Ask your health care provider how you can manage these conditions. °General instructions °· Do not drive for 24 hours after your procedure if you were given a medicine to help you relax (sedative). °· Take over-the-counter and prescription medicines only as told  by your health care provider. °· Avoid putting pressure on the area where the pacemaker was placed. °· If you need an MRI after your pacemaker has been placed, be sure to tell the health care provider who orders the MRI that you have a pacemaker. °· Avoid close and prolonged exposure to electrical devices that have strong magnetic fields. These include: °? Cell phones. Avoid keeping them in a pocket near the pacemaker, and try using the ear opposite the pacemaker. °? MP3 players. °? Household appliances, like microwaves. °? Metal detectors. °? Electric generators. °? High-tension wires. °· Keep all follow-up visits as directed by your health care provider. This is important. °Contact a health care provider if: °· You have pain at the incision site that is not relieved by over-the-counter or prescription medicines. °· You have any of these around your incision site or coming from it: °? More redness, swelling, or pain. °? Fluid or blood. °? Warmth to the touch. °? Pus or a bad smell. °· You have a fever. °· You feel brief, occasional palpitations, light-headedness, or any symptoms that you think might be related to your heart. °Get help right away if: °· You experience chest pain that is different from the pain at the pacemaker site. °· You develop a red streak that extends above or below the incision site. °· You experience shortness of breath. °· You have palpitations or an irregular heartbeat. °· You have light-headedness that does not go away quickly. °· You faint or have dizzy spells. °· Your pulse suddenly drops or increases rapidly and does not return to normal. °· You begin to gain weight and your legs and ankles swell. °Summary °· After your procedure, it is common to have pain, soreness, and some swelling where the pacemaker was inserted. °· Make sure to keep your incision clean and dry. Follow instructions from your health care provider about how to take care of your incision. °· Check your incision every  day for signs of infection, such as more pain or swelling, pus or a bad smell, warmth, or leaking fluid and blood. °· Avoid strenuous exercise and lifting your left arm higher than your shoulder for 2 weeks, or as long as told by your health care provider. °This information is not intended to replace advice given to you by your health care provider. Make sure you discuss any questions you have with your health care provider. °Document Released: 10/16/2012 Document Revised: 12/08/2016 Document Reviewed: 11/18/2015 °Elsevier Patient Education © 2020 [employer] ° °

## 2018-10-25 NOTE — H&P (Signed)
Electrophysiology Office Note   Date:  10/25/2018   ID:  Greg Oconnor, DOB 09/12/1925, MRN [id number]  PCP:  Raina Mina., MD  Cardiologist:  Bettina Gavia Primary Electrophysiologist:  Rosanne Wohlfarth Meredith Leeds, MD    No chief complaint on file.    History of Present Illness: Greg Oconnor is a 85 y.o. male who is being seen today for the evaluation of cardiomyopathy at the request of Greg Oconnor. Presenting today for electrophysiology evaluation.  Has a history of coronary artery disease,, hypertension, hyperlipidemia, Saint Jude pacemaker for heart block.  He presents today for initial visit for device follow-up.  Today, denies symptoms of palpitations, chest pain, shortness of breath, orthopnea, PND, lower extremity edema, claudication, dizziness, presyncope, syncope, bleeding, or neurologic sequela. The patient is tolerating medications without difficulties.    Past Medical History:  Diagnosis Date  . 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 CAD, multiple vessel 07/20/2015  . Cardiomyopathy ([place]) 07/20/2015  . Diabetes mellitus with stage 3 chronic kidney disease ([place]) 07/20/2015  . Essential hypertension 07/20/2015  . GERD without esophagitis 07/20/2015  . Hepatitis A   . History of pulmonary embolism 07/20/2015  . Hypertensive heart disease without heart failure 06/17/2014  . Mixed hyperlipidemia 07/20/2015  . Old myocardial infarction 07/20/2015  . Paroxysmal atrial fibrillation ([place]) 05/31/2015  . Stage 3 chronic kidney disease 07/20/2015   Past Surgical History:  Procedure Laterality Date  . APPENDECTOMY    . CHOLECYSTECTOMY    . CORONARY ARTERY BYPASS GRAFT    . CYSTOSCOPY    . INSERT / REPLACE / REMOVE PACEMAKER     St. Jude  . PROSTATECTOMY       Current Facility-Administered Medications  Medication Dose Route Frequency Provider Last Rate Last Dose  . 0.9 %  sodium chloride infusion   Intravenous Continuous Constance Haw, MD 50 mL/hr at 10/25/18 1128    . ceFAZolin (ANCEF) IVPB 2g/100 mL premix  2 g Intravenous On Call Timberlynn Kizziah Hassell Done, MD      . chlorhexidine (HIBICLENS) 4 % liquid 4 application  60 mL Topical Once Warnie Belair Hassell Done, MD      . chlorhexidine (HIBICLENS) 4 % liquid 4 application  60 mL Topical Once Skyy Nilan Hassell Done, MD      . gentamicin (GARAMYCIN) 80 mg in sodium chloride 0.9 % 500 mL irrigation  80 mg Irrigation On Call Constance Haw, MD        Allergies:   Ezetimibe; Fenofibrate micronized; Fish oil; Niacin; Simvastatin; and Tetanus toxoid, adsorbed   Social History:  The patient  reports that he has quit smoking. His smoking use included cigarettes. He has never used smokeless tobacco. He reports previous alcohol use. He reports that he does not use drugs.   Family History:  The patient's family history includes Cancer in his father; Heart Problems in his brother and mother; Parkinson's disease in his brother; Stroke in his mother.    ROS:  Please see the history of present illness.   Otherwise, review of systems is positive for none.   All other systems are reviewed and negative.   PHYSICAL EXAM: VS:  BP (!) 188/90   Pulse 97   Temp (!) 97.5 F (36.4 C) (Skin)   Resp 20   Ht 5' 7.5" (1.715 m)   Wt 61.7 kg   SpO2 99%   BMI 20.99 kg/m  , BMI Body mass index is 20.99 kg/m. GEN:  Well nourished, well developed, in no acute distress  HEENT: normal  Neck: no JVD, carotid bruits, or masses Cardiac: RRR; no murmurs, rubs, or gallops,no edema  Respiratory:  clear to auscultation bilaterally, normal work of breathing GI: soft, nontender, nondistended, + BS MS: no deformity or atrophy  Skin: warm and dry, device site well healed Neuro:  Strength and sensation are intact Psych: euthymic mood, full affect  EKG:  EKG is ordered today. Personal review of the ekg ordered shows atrial sensed, ventricular paced, PVCs  Personal review of the device interrogation  today. Results in [facility]    Recent Labs: 10/25/2018: BUN 24; Creatinine, Ser 1.22; Hemoglobin 13.7; Platelets 197; Potassium 4.3; Sodium 142    Lipid Panel  No results found for: CHOL, TRIG, HDL, CHOLHDL, VLDL, LDLCALC, LDLDIRECT   Wt Readings from Last 3 Encounters:  10/25/18 61.7 kg  10/15/18 62.2 kg  08/19/18 61.7 kg      Other studies Reviewed: Additional studies/ records that were reviewed today include: TTE 04/24/2017 Review of the above records today demonstrates:  Ejection fraction 30%.  Left atrial enlargement.  No significant aortic stenosis.  Mild to moderate mitral regurgitation.   ASSESSMENT AND PLAN:  1.  Complete heart block: plan for generator change today. 2.  Coronary artery disease: No current chest pain.  Plan per primary cardiology.  3.  Hypertension: Elevated here but is usually well controlled  4.  Paroxysmal atrial fibrillation: Currently on warfarin.  This patients CHA2DS2-VASc Score and unadjusted Ischemic Stroke Rate (% per year) is equal to 4.8 % stroke rate/year from a score of 4  Above score calculated as 1 point each if present [CHF, HTN, DM, Vascular=MI/PAD/Aortic Plaque, Age if 65-74, or Male] Above score calculated as 2 points each if present [Age > 75, or Stroke/TIA/TE]   Greg Oconnor has presented today for surgery, with the diagnosis of complete heart block.  The various methods of treatment have been discussed with the patient and family. After consideration of risks, benefits and other options for treatment, the patient has consented to  Procedure(s): Pacemaker generator chagne as a surgical intervention .  Risks include but not limited to bleeding, tamponade, infection, pneumothorax, among others. The patient's history has been reviewed, patient examined, no change in status, stable for surgery.  I have reviewed the patient's chart and labs.  Questions were answered to the patient's satisfaction.    Greg Oconnor Greg Fortis, MD 10/25/2018  1:52 PM

## 2018-10-28 ENCOUNTER — Encounter (HOSPITAL_COMMUNITY): Payer: Self-pay | Admitting: Cardiology

## 2018-10-28 MED FILL — Lidocaine HCl Local Inj 1%: INTRAMUSCULAR | Qty: 60 | Status: AC

## 2018-11-05 ENCOUNTER — Other Ambulatory Visit: Payer: Self-pay

## 2018-11-05 ENCOUNTER — Ambulatory Visit (INDEPENDENT_AMBULATORY_CARE_PROVIDER_SITE_OTHER): Payer: Medicare Other | Admitting: *Deleted

## 2018-11-05 DIAGNOSIS — I480 Paroxysmal atrial fibrillation: Secondary | ICD-10-CM

## 2018-11-05 LAB — POCT INR: INR: 1.2 — AB (ref 2.0–3.0)

## 2018-11-05 NOTE — Patient Instructions (Signed)
Take 1 1/2 tablets x 2 days then resume 1 tablet daily except 1/2 tablet each Saturday.  Repeat INR in 1 week

## 2018-11-06 ENCOUNTER — Other Ambulatory Visit: Payer: Self-pay | Admitting: Cardiology

## 2018-11-06 DIAGNOSIS — E782 Mixed hyperlipidemia: Secondary | ICD-10-CM

## 2018-11-06 DIAGNOSIS — I442 Atrioventricular block, complete: Secondary | ICD-10-CM

## 2018-11-06 DIAGNOSIS — I10 Essential (primary) hypertension: Secondary | ICD-10-CM

## 2018-11-06 DIAGNOSIS — Z950 Presence of cardiac pacemaker: Secondary | ICD-10-CM

## 2018-11-06 DIAGNOSIS — Z7901 Long term (current) use of anticoagulants: Secondary | ICD-10-CM

## 2018-11-06 DIAGNOSIS — I25119 Atherosclerotic heart disease of native coronary artery with unspecified angina pectoris: Secondary | ICD-10-CM

## 2018-11-06 DIAGNOSIS — I951 Orthostatic hypotension: Secondary | ICD-10-CM

## 2018-11-07 ENCOUNTER — Ambulatory Visit: Payer: Medicare Other

## 2018-11-07 ENCOUNTER — Telehealth: Payer: Self-pay | Admitting: Cardiology

## 2018-11-07 NOTE — Telephone Encounter (Signed)
Pt cancelled wound check appt for today and wants to schedule for next week

## 2018-11-07 NOTE — Telephone Encounter (Signed)
New message:    Patient calling to cancel appt however, they need a appt next week and there is nothing. Please call patient.

## 2018-11-07 NOTE — Telephone Encounter (Signed)
Patient is scheduled for wound check on 11/12/18 at 11:30.

## 2018-11-12 ENCOUNTER — Ambulatory Visit (INDEPENDENT_AMBULATORY_CARE_PROVIDER_SITE_OTHER): Payer: Medicare Other | Admitting: Pharmacist

## 2018-11-12 ENCOUNTER — Other Ambulatory Visit: Payer: Self-pay

## 2018-11-12 ENCOUNTER — Ambulatory Visit: Payer: Medicare Other

## 2018-11-12 ENCOUNTER — Ambulatory Visit (INDEPENDENT_AMBULATORY_CARE_PROVIDER_SITE_OTHER): Payer: Medicare Other | Admitting: *Deleted

## 2018-11-12 DIAGNOSIS — I442 Atrioventricular block, complete: Secondary | ICD-10-CM

## 2018-11-12 DIAGNOSIS — I480 Paroxysmal atrial fibrillation: Secondary | ICD-10-CM

## 2018-11-12 DIAGNOSIS — Z950 Presence of cardiac pacemaker: Secondary | ICD-10-CM | POA: Diagnosis not present

## 2018-11-12 LAB — CUP PACEART INCLINIC DEVICE CHECK
Battery Remaining Longevity: 117 mo
Battery Voltage: 3.07 V
Brady Statistic RA Percent Paced: 0 %
Brady Statistic RV Percent Paced: 99.02 %
Date Time Interrogation Session: 20201103173709
Implantable Lead Implant Date: 19980122
Implantable Lead Implant Date: 20040315
Implantable Lead Location: 753859
Implantable Lead Location: 753860
Implantable Pulse Generator Implant Date: 20201016
Lead Channel Impedance Value: 387.5 Ohm
Lead Channel Impedance Value: 675 Ohm
Lead Channel Pacing Threshold Amplitude: 0.75 V
Lead Channel Pacing Threshold Pulse Width: 0.5 ms
Lead Channel Sensing Intrinsic Amplitude: 2.1 mV
Lead Channel Setting Pacing Amplitude: 2.5 V
Lead Channel Setting Pacing Pulse Width: 0.5 ms
Lead Channel Setting Sensing Sensitivity: 4 mV
Pulse Gen Model: 2272
Pulse Gen Serial Number: 9168465

## 2018-11-12 LAB — POCT INR: INR: 2.2 (ref 2.0–3.0)

## 2018-11-12 NOTE — Progress Notes (Signed)
Wound check appointment. Steri-strips removed. Wound without redness or edema. Incision edges approximated, wound well healed. Normal device function. Thresholds, sensing, and impedances consistent with implant measurements. Device programmed at chronic RV output s/p generator replacement. Histogram distribution appropriate for patient and level of activity. 6 mode switches (<1%)--AT, longest 10sec. No high ventricular rates noted. Patient educated about wound care, arm mobility, and Merlin monitor. ROV with Dr. Curt Bears on 01/27/19.

## 2018-11-20 DIAGNOSIS — K529 Noninfective gastroenteritis and colitis, unspecified: Secondary | ICD-10-CM | POA: Insufficient documentation

## 2018-11-20 HISTORY — DX: Noninfective gastroenteritis and colitis, unspecified: K52.9

## 2018-11-26 ENCOUNTER — Ambulatory Visit (INDEPENDENT_AMBULATORY_CARE_PROVIDER_SITE_OTHER): Payer: Medicare Other | Admitting: Pharmacist

## 2018-11-26 ENCOUNTER — Other Ambulatory Visit: Payer: Self-pay

## 2018-11-26 DIAGNOSIS — Z7901 Long term (current) use of anticoagulants: Secondary | ICD-10-CM | POA: Diagnosis not present

## 2018-11-26 DIAGNOSIS — I480 Paroxysmal atrial fibrillation: Secondary | ICD-10-CM

## 2018-11-26 LAB — POCT INR: INR: 1.9 — AB (ref 2.0–3.0)

## 2018-11-26 NOTE — Patient Instructions (Signed)
Take 1.5 tablets today and tomorrow then continue taking 5mg  daily. Repeat INR in 1 week

## 2018-12-03 ENCOUNTER — Other Ambulatory Visit: Payer: Self-pay

## 2018-12-03 ENCOUNTER — Ambulatory Visit (INDEPENDENT_AMBULATORY_CARE_PROVIDER_SITE_OTHER): Payer: Medicare Other | Admitting: Pharmacist

## 2018-12-03 DIAGNOSIS — I480 Paroxysmal atrial fibrillation: Secondary | ICD-10-CM

## 2018-12-03 LAB — POCT INR: INR: 2.5 (ref 2.0–3.0)

## 2018-12-03 NOTE — Patient Instructions (Signed)
Description   Continue taking 5mg  of warfarin daily. Repeat INR in 3 weeks.

## 2018-12-24 ENCOUNTER — Other Ambulatory Visit: Payer: Self-pay

## 2018-12-24 ENCOUNTER — Ambulatory Visit (INDEPENDENT_AMBULATORY_CARE_PROVIDER_SITE_OTHER): Payer: Medicare Other | Admitting: *Deleted

## 2018-12-24 DIAGNOSIS — I480 Paroxysmal atrial fibrillation: Secondary | ICD-10-CM | POA: Diagnosis not present

## 2018-12-24 LAB — POCT INR: INR: 2.8 (ref 2.0–3.0)

## 2018-12-24 NOTE — Patient Instructions (Signed)
Continue taking 5mg  of warfarin daily. Repeat INR in 4 weeks.

## 2019-01-06 ENCOUNTER — Telehealth: Payer: Self-pay | Admitting: Emergency Medicine

## 2019-01-06 NOTE — Telephone Encounter (Signed)
Patient was aymptomatic with 26 cycle of NSVT that occured on 01/04/19 @ 8:41 am. Patient stated he was walking at the time and feels fine.

## 2019-01-21 ENCOUNTER — Ambulatory Visit (INDEPENDENT_AMBULATORY_CARE_PROVIDER_SITE_OTHER): Payer: Medicare Other | Admitting: *Deleted

## 2019-01-21 ENCOUNTER — Telehealth: Payer: Self-pay

## 2019-01-21 ENCOUNTER — Other Ambulatory Visit: Payer: Self-pay

## 2019-01-21 DIAGNOSIS — I480 Paroxysmal atrial fibrillation: Secondary | ICD-10-CM

## 2019-01-21 LAB — POCT INR: INR: 2.7 (ref 2.0–3.0)

## 2019-01-21 NOTE — Telephone Encounter (Signed)
Called patient to notify that he is due for another INR appt. Unable to leave message, number just kept ringing. Tried 3 times. If he calls back, he needs INR appt.

## 2019-01-21 NOTE — Patient Instructions (Addendum)
Continue taking 5mg  of warfarin daily except 2.5mg  on Saturdays. Repeat INR in 5 weeks.

## 2019-01-24 ENCOUNTER — Ambulatory Visit (INDEPENDENT_AMBULATORY_CARE_PROVIDER_SITE_OTHER): Payer: Medicare Other | Admitting: *Deleted

## 2019-01-24 DIAGNOSIS — I442 Atrioventricular block, complete: Secondary | ICD-10-CM | POA: Diagnosis not present

## 2019-01-24 LAB — CUP PACEART REMOTE DEVICE CHECK
Battery Remaining Longevity: 124 mo
Battery Remaining Percentage: 95.5 %
Battery Voltage: 3.02 V
Brady Statistic AS VP Percent: 98 %
Brady Statistic AS VS Percent: 1 %
Brady Statistic RV Percent Paced: 99 %
Date Time Interrogation Session: 20210115020015
Implantable Lead Implant Date: 19980122
Implantable Lead Implant Date: 20040315
Implantable Lead Location: 753859
Implantable Lead Location: 753860
Implantable Pulse Generator Implant Date: 20201016
Lead Channel Impedance Value: 400 Ohm
Lead Channel Impedance Value: 660 Ohm
Lead Channel Pacing Threshold Amplitude: 0.75 V
Lead Channel Pacing Threshold Pulse Width: 0.5 ms
Lead Channel Sensing Intrinsic Amplitude: 2.6 mV
Lead Channel Setting Pacing Amplitude: 2.5 V
Lead Channel Setting Pacing Pulse Width: 0.5 ms
Lead Channel Setting Sensing Sensitivity: 4 mV
Pulse Gen Model: 2272
Pulse Gen Serial Number: 9168465

## 2019-01-24 NOTE — Progress Notes (Signed)
PPM remote 

## 2019-01-26 NOTE — Progress Notes (Signed)
Electrophysiology Office Note   Date:  01/27/2019   ID:  Greg Oconnor, DOB 04/14/1925, MRN [id number]  PCP:  [name], MD  Cardiologist:  Dulce Sellar Primary Electrophysiologist:  Regan Lemming, MD    No chief complaint on file.    History of Present Illness: Greg Oconnor is a 86 y.o. male who is being seen today for the evaluation of cardiomyopathy at the request of [name]. Presenting today for electrophysiology evaluation.  Has a history of coronary artery disease,, hypertension, hyperlipidemia, Saint Jude pacemaker for heart block.  He presents today for initial visit for device follow-up.  Today, denies symptoms of palpitations, chest pain, shortness of breath, orthopnea, PND, lower extremity edema, claudication, dizziness, presyncope, syncope, bleeding, or neurologic sequela. The patient is tolerating medications without difficulties.  Overall he is doing well.  He has no chest pain or shortness of breath.  Is able do all his daily activities without restriction.  Past Medical History:  Diagnosis Date  . 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 CAD, multiple vessel 07/20/2015  . Cardiomyopathy (HCC) 07/20/2015  . Diabetes mellitus with stage 3 chronic kidney disease (HCC) 07/20/2015  . Essential hypertension 07/20/2015  . GERD without esophagitis 07/20/2015  . Hepatitis A   . History of pulmonary embolism 07/20/2015  . Hypertensive heart disease without heart failure 06/17/2014  . Mixed hyperlipidemia 07/20/2015  . Old myocardial infarction 07/20/2015  . Paroxysmal atrial fibrillation (HCC) 05/31/2015  . Stage 3 chronic kidney disease 07/20/2015   Past Surgical History:  Procedure Laterality Date  . APPENDECTOMY    . CHOLECYSTECTOMY    . CORONARY ARTERY BYPASS GRAFT    . CYSTOSCOPY    . INSERT / REPLACE / REMOVE PACEMAKER     St. Jude  . PPM GENERATOR CHANGEOUT N/A 10/25/2018   Procedure: PPM GENERATOR CHANGEOUT;   Surgeon: Regan Lemming, MD;  Location: [facility];  Service: Cardiovascular;  Laterality: N/A;  . PROSTATECTOMY       Current Outpatient Medications  Medication Sig Dispense Refill  . acetaminophen (TYLENOL) 500 MG tablet Take 500 mg by mouth every 6 (six) hours as needed for moderate pain or headache.    . cetirizine (ZYRTEC) 10 MG tablet Take 10 mg by mouth daily as needed for allergies.     . cholestyramine (QUESTRAN) 4 GM/DOSE powder Take 4 g by mouth daily as needed (loose stools).     . folic acid (FOLVITE) 800 MCG tablet Take 800 mcg by mouth daily.    Marland Kitchen lisinopril (ZESTRIL) 20 MG tablet Take 10-20 mg by mouth daily as needed (if systolic bp is 135 or greater).     . nitroGLYCERIN (NITROSTAT) 0.4 MG SL tablet Place 0.4 mg under the tongue every 5 (five) minutes as needed for chest pain.     . Simethicone (GAS-X EXTRA STRENGTH) 125 MG CAPS Take 125 mg by mouth daily as needed (gas).    . sodium chloride (OCEAN) 0.65 % SOLN nasal spray Place 1 spray into both nostrils as needed for congestion.    . trolamine salicylate (ASPERCREME) 10 % cream Apply 1 application topically as needed for muscle pain.    . vitamin B-12 (CYANOCOBALAMIN) 500 MCG tablet Take 500 mcg by mouth daily.    Marland Kitchen warfarin (COUMADIN) 5 MG tablet Take 1 tablet (5 mg total) by mouth See admin instructions. Take 5 mg in the evening daily except Saturday take 2.5 mg in  the evening 90 tablet 0   No current facility-administered medications for this visit.    Allergies:   Ezetimibe; Fenofibrate micronized; Fish oil; Niacin; Simvastatin; and Tetanus toxoid, adsorbed   Social History:  The patient  reports that he has quit smoking. His smoking use included cigarettes. He has never used smokeless tobacco. He reports previous alcohol use. He reports that he does not use drugs.   Family History:  The patient's family history includes Cancer in his father; Heart Problems in his brother and mother; Parkinson's  disease in his brother; Stroke in his mother.    ROS:  Please see the history of present illness.   Otherwise, review of systems is positive for none.   All other systems are reviewed and negative.   PHYSICAL EXAM: VS:  BP (!) 148/82   Pulse 93   Ht 5' 7.5" (1.715 m)   Wt 139 lb 3.2 oz (63.1 kg)   SpO2 96%   BMI 21.48 kg/m  , BMI Body mass index is 21.48 kg/m. GEN: Well nourished, well developed, in no acute distress  HEENT: normal  Neck: no JVD, carotid bruits, or masses Cardiac: RRR; no murmurs, rubs, or gallops,no edema  Respiratory:  clear to auscultation bilaterally, normal work of breathing GI: soft, nontender, nondistended, + BS MS: no deformity or atrophy  Skin: warm and dry, device site well healed Neuro:  Strength and sensation are intact Psych: euthymic mood, full affect  EKG:  EKG is ordered today. Personal review of the ekg ordered shows AV paced  Personal review of the device interrogation today. Results in [place]: 10/25/2018: BUN 24; Creatinine, Ser 1.22; Hemoglobin 13.7; Platelets 197; Potassium 4.3; Sodium 142    Lipid Panel  No results found for: CHOL, TRIG, HDL, CHOLHDL, VLDL, LDLCALC, LDLDIRECT   Wt Readings from Last 3 Encounters:  01/27/19 139 lb 3.2 oz (63.1 kg)  10/25/18 136 lb (61.7 kg)  10/15/18 137 lb 3.2 oz (62.2 kg)      Other studies Reviewed: Additional studies/ records that were reviewed today include: TTE 04/24/2017 Review of the above records today demonstrates:  Ejection fraction 30%.  Left atrial enlargement.  No significant aortic stenosis.  Mild to moderate mitral regurgitation.   ASSESSMENT AND PLAN:  1.  Complete heart block: Status post Saint Jude dual-chamber pacemaker with generator change 10/25/2018.  Device functioning appropriately.  Elevated threshold on the atrial lead but rarely paces.  No changes.    2.  Coronary artery disease: No current chest pain.  Plan per primary cardiology.  3.   Hypertension: Weighted today but usually well controlled.  No changes.  4.  Paroxysmal atrial fibrillation: Currently on warfarin.  CHA2DS2-VASc of 4.  Minimally noted on device interrogation.    Current medicines are reviewed at length with the patient today.   The patient does not have concerns regarding his medicines.  The following changes were made today: None  Labs/ tests ordered today include:  Orders Placed This Encounter  Procedures  . EKG 12-Lead     Disposition:   FU with Greg Oconnor 12 months  Signed, Greg Kovacevic Meredith Leeds, MD  01/27/2019 10:43 AM     [facility] [address] [place] [address] [phone] (office) [phone] (fax)

## 2019-01-27 ENCOUNTER — Ambulatory Visit (INDEPENDENT_AMBULATORY_CARE_PROVIDER_SITE_OTHER): Payer: Medicare Other | Admitting: Cardiology

## 2019-01-27 ENCOUNTER — Other Ambulatory Visit: Payer: Self-pay

## 2019-01-27 ENCOUNTER — Encounter: Payer: Self-pay | Admitting: Cardiology

## 2019-01-27 VITALS — BP 148/82 | HR 93 | Ht 67.5 in | Wt 139.2 lb

## 2019-01-27 DIAGNOSIS — I442 Atrioventricular block, complete: Secondary | ICD-10-CM | POA: Diagnosis not present

## 2019-01-27 DIAGNOSIS — Z950 Presence of cardiac pacemaker: Secondary | ICD-10-CM

## 2019-01-27 DIAGNOSIS — I480 Paroxysmal atrial fibrillation: Secondary | ICD-10-CM | POA: Diagnosis not present

## 2019-01-27 LAB — CUP PACEART INCLINIC DEVICE CHECK
Battery Remaining Longevity: 117 mo
Battery Voltage: 3.02 V
Brady Statistic RA Percent Paced: 0 %
Brady Statistic RV Percent Paced: 99 %
Date Time Interrogation Session: 20210118114000
Implantable Lead Implant Date: 19980122
Implantable Lead Implant Date: 20040315
Implantable Lead Location: 753859
Implantable Lead Location: 753860
Implantable Pulse Generator Implant Date: 20201016
Lead Channel Impedance Value: 412.5 Ohm
Lead Channel Impedance Value: 762.5 Ohm
Lead Channel Pacing Threshold Amplitude: 0.75 V
Lead Channel Pacing Threshold Pulse Width: 0.5 ms
Lead Channel Sensing Intrinsic Amplitude: 1.5 mV
Lead Channel Setting Pacing Amplitude: 2.5 V
Lead Channel Setting Pacing Pulse Width: 0.5 ms
Lead Channel Setting Sensing Sensitivity: 4 mV
Pulse Gen Model: 2272
Pulse Gen Serial Number: 9168465

## 2019-01-27 NOTE — Patient Instructions (Signed)
Medication Instructions:  Your physician recommends that you continue on your current medications as directed. Please refer to the Current Medication list given to you today.  *If you need a refill on your cardiac medications before your next appointment, please call your pharmacy*  Labwork: None ordered  Testing/Procedures: None ordered  Follow-Up: Remote monitoring is used to monitor your Pacemaker or ICD from home. This monitoring reduces the number of office visits required to check your device to one time per year. It allows [place] to keep an eye on the functioning of your device to ensure it is working properly. You are scheduled for a device check from home on 04/25/2019. You may send your transmission at any time that day. If you have a wireless device, the transmission will be sent automatically. After your physician reviews your transmission, you will receive a postcard with your next transmission date.  Your physician wants you to follow-up in: 1 year with Dr. [name].  You will receive a reminder letter in the mail two months in advance. If you don't receive a letter, please call our office to schedule the follow-up appointment.   Thank you for choosing [facility]!!   Dory Horn, RN [phone]

## 2019-02-10 ENCOUNTER — Other Ambulatory Visit: Payer: Self-pay | Admitting: Cardiology

## 2019-02-10 DIAGNOSIS — I442 Atrioventricular block, complete: Secondary | ICD-10-CM

## 2019-02-10 DIAGNOSIS — Z7901 Long term (current) use of anticoagulants: Secondary | ICD-10-CM

## 2019-02-10 DIAGNOSIS — I10 Essential (primary) hypertension: Secondary | ICD-10-CM

## 2019-02-10 DIAGNOSIS — Z950 Presence of cardiac pacemaker: Secondary | ICD-10-CM

## 2019-02-10 DIAGNOSIS — I25119 Atherosclerotic heart disease of native coronary artery with unspecified angina pectoris: Secondary | ICD-10-CM

## 2019-02-10 DIAGNOSIS — E782 Mixed hyperlipidemia: Secondary | ICD-10-CM

## 2019-02-10 DIAGNOSIS — I951 Orthostatic hypotension: Secondary | ICD-10-CM

## 2019-02-25 ENCOUNTER — Other Ambulatory Visit: Payer: Self-pay

## 2019-02-25 ENCOUNTER — Ambulatory Visit (INDEPENDENT_AMBULATORY_CARE_PROVIDER_SITE_OTHER): Payer: Medicare Other | Admitting: Pharmacist Clinician (PhC)/ Clinical Pharmacy Specialist

## 2019-02-25 DIAGNOSIS — I480 Paroxysmal atrial fibrillation: Secondary | ICD-10-CM

## 2019-02-25 LAB — POCT INR: INR: 2.9 (ref 2.0–3.0)

## 2019-02-25 NOTE — Patient Instructions (Signed)
Continue taking 5mg  of warfarin daily except 2.5mg  on Saturdays. Repeat INR in 6 weeks.

## 2019-03-25 ENCOUNTER — Ambulatory Visit (INDEPENDENT_AMBULATORY_CARE_PROVIDER_SITE_OTHER): Payer: Medicare Other | Admitting: *Deleted

## 2019-03-25 ENCOUNTER — Other Ambulatory Visit: Payer: Self-pay

## 2019-03-25 DIAGNOSIS — I480 Paroxysmal atrial fibrillation: Secondary | ICD-10-CM

## 2019-03-25 LAB — POCT INR: INR: 3.2 — AB (ref 2.0–3.0)

## 2019-03-25 NOTE — Patient Instructions (Signed)
Hold warfarin tonight then resume 5mg  of warfarin daily except 2.5mg  on Saturdays. Repeat INR in 6 weeks

## 2019-04-25 ENCOUNTER — Ambulatory Visit (INDEPENDENT_AMBULATORY_CARE_PROVIDER_SITE_OTHER): Payer: Medicare Other | Admitting: *Deleted

## 2019-04-25 DIAGNOSIS — I442 Atrioventricular block, complete: Secondary | ICD-10-CM | POA: Diagnosis not present

## 2019-04-25 LAB — CUP PACEART REMOTE DEVICE CHECK
Battery Remaining Longevity: 120 mo
Battery Remaining Percentage: 95.5 %
Battery Voltage: 3.02 V
Brady Statistic AS VP Percent: 98 %
Brady Statistic AS VS Percent: 1 %
Brady Statistic RV Percent Paced: 99 %
Date Time Interrogation Session: 20210416020013
Implantable Lead Implant Date: 19980122
Implantable Lead Implant Date: 20040315
Implantable Lead Location: 753859
Implantable Lead Location: 753860
Implantable Pulse Generator Implant Date: 20201016
Lead Channel Impedance Value: 390 Ohm
Lead Channel Impedance Value: 650 Ohm
Lead Channel Pacing Threshold Amplitude: 0.75 V
Lead Channel Pacing Threshold Pulse Width: 0.5 ms
Lead Channel Sensing Intrinsic Amplitude: 2.1 mV
Lead Channel Sensing Intrinsic Amplitude: 7.2 mV
Lead Channel Setting Pacing Amplitude: 2.5 V
Lead Channel Setting Pacing Pulse Width: 0.5 ms
Lead Channel Setting Sensing Sensitivity: 4 mV
Pulse Gen Model: 2272
Pulse Gen Serial Number: 9168465

## 2019-04-25 NOTE — Progress Notes (Signed)
PPM Remote  

## 2019-04-27 NOTE — Progress Notes (Signed)
Cardiology Office Note:    Date:  04/28/2019   ID:  Greg Oconnor, DOB 10-27-1925, MRN [id number]  PCP:  [name], MD  Cardiologist:  [name], MD    Referring MD: [name], MD    ASSESSMENT:    1. Statin intolerance   2. Pacemaker   3. Anticoagulant long-term use   4. Coronary artery disease involving native coronary artery of native heart with angina pectoris (HCC)   5. Hypertensive heart disease without heart failure   6. Orthostatic hypotension   7. Mixed hyperlipidemia    PLAN:    In order of problems listed above:  1. Continue nonstatin lipid-lowering therapy with cholestyramine I think it is appropriate in his age group 86 years old and with stable CAD. 2. Stable function followed in device clinic 3. Continue long-term anticoagulation with venous thromboembolism warfarin goal INR is two 2.5 4. Stable CAD no anginal discomfort continue medical therapy renew his prescription for nitroglycerin.  At this time does not need ischemia evaluation 5. Stable hypertension he is not having symptomatic hypotension has good healthcare literacy and adjust his meds at home   Next appointment: 6 months   Medication Adjustments/Labs and Tests Ordered: Current medicines are reviewed at length with the patient today.  Concerns regarding medicines are outlined above.  No orders of the defined types were placed in this encounter.  No orders of the defined types were placed in this encounter.   Chief Complaint  Patient presents with  . Follow-up    pacemaker  . Coronary Artery Disease  . Anticoagulation  . Hypertension  . Hypotension    History of Present Illness:    Greg Oconnor is a 86 y.o. male with a hx of CAD CABG 2005 hypertension hypotension hyperlipidemia pacemaker for heart block St Jude and chronic anticoagulation last seen 10/15/2018.  Is followed by anticoagulant clinic and his last 3 INRs were in range 2.72.9 and 3.2.  Review labs primary  care physician 11/20/2018 hemoglobin A1c 5.9%, CMP normal creatinine 1.18 potassium 4.5 GFR mildly reduced due to age 68 cc, hemoglobin 12.3.   05/21/2018 cholesterol 159 triglyceride 96 LDL at target for this patient who is statin intolerant 110 HDL 38 pacemaker checks 04/25/2019 personally reviewed normal device function and parameters he has brief episodes of mode switching all less than 30 seconds and no sustained episodes of atrial fibrillation. Compliance with diet, lifestyle and medications: Yes  Overall he is doing well.  And he does not take lisinopril for systolics of less than 1 30-105.  He is also having pain in his neck and shoulders he had works outside in his arms I told him he can use topical Voltaren as needed.  He tolerates warfarin without bleeding and he said no angina edema shortness of breath or palpitation.  Basically is the same as it was a year ago and he is getting ready to start his GERD outdoors. Past Medical History:  Diagnosis Date  . Anemia, chronic disease 05/12/2016  . Anticoagulant long-term use 07/19/2015  . Anticoagulated 06/17/2014  . Atrioventricular block, complete (HCC) 12/31/2014  . 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 BPH (benign prostatic hyperplasia) 07/20/2015  . CAD, multiple vessel 07/20/2015  . Cardiomyopathy (HCC) 07/20/2015  . Chronic diarrhea 11/20/2018  . Coronary artery disease involving native coronary artery of native heart with angina pectoris (HCC) 07/20/2015  . Diabetes mellitus with stage 3 chronic kidney disease (HCC) 07/20/2015  .  Essential hypertension 07/20/2015  . GERD without esophagitis 07/20/2015  . Hepatitis A   . History of pulmonary embolism 07/20/2015  . Hypertensive heart disease without heart failure 06/17/2014  . Mixed hyperlipidemia 07/20/2015  . Old myocardial infarction 07/20/2015  . 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 Orthostatic hypotension 07/20/2015  . Pacemaker 06/17/2014   OLD ATRIAL LEAD  failure  . Pacemaker reprogramming/check 06/17/2014   Formatting of this note might be different from the original. OLD ATRIAL LEAD failure  . Paroxysmal atrial fibrillation ([place]) 05/31/2015  . Stage 3 chronic kidney disease 07/20/2015  . Statin intolerance 10/13/2018  . Subconjunctival hemorrhage 07/20/2015  . Type 2 diabetes mellitus without complication, without long-term current use of insulin ([place]) 07/20/2015  . Vitamin B12 deficiency 07/20/2015    Past Surgical History:  Procedure Laterality Date  . APPENDECTOMY    . CHOLECYSTECTOMY    . CORONARY ARTERY BYPASS GRAFT    . CYSTOSCOPY    . INSERT / REPLACE / REMOVE PACEMAKER     St. Jude  . PPM GENERATOR CHANGEOUT N/A 10/25/2018   Procedure: PPM GENERATOR CHANGEOUT;  Surgeon: Constance Haw, MD;  Location: [place] [facility];  Service: Cardiovascular;  Laterality: N/A;  . PROSTATECTOMY      Current Medications: Current Meds  Medication Sig  . acetaminophen (TYLENOL) 500 MG tablet Take 500 mg by mouth every 6 (six) hours as needed for moderate pain or headache.  . cetirizine (ZYRTEC) 10 MG tablet Take 10 mg by mouth daily as needed for allergies.   . cholestyramine (QUESTRAN) 4 GM/DOSE powder Take 4 g by mouth daily as needed (loose stools).   . folic acid (FOLVITE) 213 MCG tablet Take 800 mcg by mouth daily.  Marland Kitchen lisinopril (ZESTRIL) 20 MG tablet Take 10-20 mg by mouth daily as needed (if systolic bp is 086 or greater).   . nitroGLYCERIN (NITROSTAT) 0.4 MG SL tablet Place 0.4 mg under the tongue every 5 (five) minutes as needed for chest pain.   . Simethicone (GAS-X EXTRA STRENGTH) 125 MG CAPS Take 125 mg by mouth daily as needed (gas).  . sodium chloride (OCEAN) 0.65 % SOLN nasal spray Place 1 spray into both nostrils as needed for congestion.  . trolamine salicylate (ASPERCREME) 10 % cream Apply 1 application topically as needed for muscle pain.  . vitamin B-12 (CYANOCOBALAMIN) 500 MCG tablet Take 500 mcg by mouth daily.  Marland Kitchen  warfarin (COUMADIN) 5 MG tablet TAKE 1 TABLET BY MOUTH IN THE EVENING ONCE DAILY EXCEPT SATURDAY TAKE 1/2 TABLET IN THE EVENING     Allergies:   Ezetimibe; Fenofibrate micronized; Fish oil; Niacin; Simvastatin; and Tetanus toxoid, adsorbed   Social History   Socioeconomic History  . Marital status: Married    Spouse name: Not on file  . Number of children: Not on file  . Years of education: Not on file  . Highest education level: Not on file  Occupational History  . Not on file  Tobacco Use  . Smoking status: Former Smoker    Types: Cigarettes  . Smokeless tobacco: Never Used  Substance and Sexual Activity  . Alcohol use: Not Currently  . Drug use: Never  . Sexual activity: Not on file  Other Topics Concern  . Not on file  Social History Narrative  . Not on file   Social Determinants of Health   Financial Resource Strain:   . Difficulty of Paying Living Expenses:   Food Insecurity:   . Worried About Estate manager/land agent  of Food in the Last Year:   . Ran Out of Food in the Last Year:   Transportation Needs:   . Lack of Transportation (Medical):   Marland Kitchen Lack of Transportation (Non-Medical):   Physical Activity:   . Days of Exercise per Week:   . Minutes of Exercise per Session:   Stress:   . Feeling of Stress :   Social Connections:   . Frequency of Communication with Friends and Family:   . Frequency of Social Gatherings with Friends and Family:   . Attends Religious Services:   . Active Member of Clubs or Organizations:   . Attends Banker Meetings:   Marland Kitchen Marital Status:      Family History: The patient's family history includes Cancer in his father; Heart Problems in his brother and mother; Parkinson's disease in his brother; Stroke in his mother. ROS:   Please see the history of present illness.    All other systems reviewed and are negative.  EKGs/Labs/Other Studies Reviewed:    The following studies were reviewed today:    Recent Labs: 10/25/2018:  BUN 24; Creatinine, Ser 1.22; Hemoglobin 13.7; Platelets 197; Potassium 4.3; Sodium 142    Physical Exam:    VS:  BP (!) 144/72   Pulse 99   Temp (!) 97.3 F (36.3 C)   Ht 5\' 7"  (1.702 m)   Wt 138 lb (62.6 kg)   SpO2 98%   BMI 21.61 kg/m     Wt Readings from Last 3 Encounters:  04/28/19 138 lb (62.6 kg)  01/27/19 139 lb 3.2 oz (63.1 kg)  10/25/18 136 lb (61.7 kg)     GEN: He appears his age she is a little bit frail but alert well nourished, well developed in no acute distress HEENT: Normal NECK: No JVD; No carotid bruits LYMPHATICS: No lymphadenopathy CARDIAC: RRR, no murmurs, rubs, gallops RESPIRATORY:  Clear to auscultation without rales, wheezing or rhonchi  ABDOMEN: Soft, non-tender, non-distended MUSCULOSKELETAL:  No edema; No deformity  SKIN: Warm and dry NEUROLOGIC:  Alert and oriented x 3 PSYCHIATRIC:  Normal affect    Signed, 10/27/18, MD  04/28/2019 3:38 PM    [facility]

## 2019-04-28 ENCOUNTER — Other Ambulatory Visit: Payer: Self-pay

## 2019-04-28 ENCOUNTER — Ambulatory Visit (INDEPENDENT_AMBULATORY_CARE_PROVIDER_SITE_OTHER): Payer: Medicare Other | Admitting: Cardiology

## 2019-04-28 ENCOUNTER — Encounter: Payer: Self-pay | Admitting: Cardiology

## 2019-04-28 VITALS — BP 144/72 | HR 99 | Temp 97.3°F | Ht 67.0 in | Wt 138.0 lb

## 2019-04-28 DIAGNOSIS — Z7901 Long term (current) use of anticoagulants: Secondary | ICD-10-CM | POA: Diagnosis not present

## 2019-04-28 DIAGNOSIS — I25119 Atherosclerotic heart disease of native coronary artery with unspecified angina pectoris: Secondary | ICD-10-CM

## 2019-04-28 DIAGNOSIS — Z789 Other specified health status: Secondary | ICD-10-CM

## 2019-04-28 DIAGNOSIS — E782 Mixed hyperlipidemia: Secondary | ICD-10-CM

## 2019-04-28 DIAGNOSIS — Z950 Presence of cardiac pacemaker: Secondary | ICD-10-CM

## 2019-04-28 DIAGNOSIS — I951 Orthostatic hypotension: Secondary | ICD-10-CM

## 2019-04-28 DIAGNOSIS — I119 Hypertensive heart disease without heart failure: Secondary | ICD-10-CM

## 2019-04-28 MED ORDER — NITROGLYCERIN 0.4 MG SL SUBL: 0.4000 mg | SUBLINGUAL_TABLET | SUBLINGUAL | 0 refills | Status: DC | PRN

## 2019-04-28 NOTE — Patient Instructions (Signed)
Medication Instructions:  Your physician recommends that you continue on your current medications as directed. Please refer to the Current Medication list given to you today.  *If you need a refill on your cardiac medications before your next appointment, please call your pharmacy*   Lab Work: None If you have labs (blood work) drawn today and your tests are completely normal, you will receive your results only by: Marland Kitchen MyChart Message (if you have MyChart) OR . A paper copy in the mail If you have any lab test that is abnormal or we need to change your treatment, we will call you to review the results.   Testing/Procedures: None   Follow-Up: At [facility], you and your health needs are our priority.  As part of our continuing mission to provide you with exceptional heart care, we have created designated Provider Care Teams.  These Care Teams include your primary Cardiologist (physician) and Advanced Practice Providers (APPs -  Physician Assistants and Nurse Practitioners) who all work together to provide you with the care you need, when you need it.  We recommend signing up for the patient portal called "MyChart".  Sign up information is provided on this After Visit Summary.  MyChart is used to connect with patients for Virtual Visits (Telemedicine).  Patients are able to view lab/test results, encounter notes, upcoming appointments, etc.  Non-urgent messages can be sent to your provider as well.   To learn more about what you can do with MyChart, go to [email or web address].    Your next appointment:   6 month(s)  The format for your next appointment:   In Person  Provider:   [name], MD   Other Instructions Please get some Voltaren topical over the counter for your neck.

## 2019-05-06 ENCOUNTER — Other Ambulatory Visit: Payer: Self-pay

## 2019-05-06 ENCOUNTER — Ambulatory Visit (INDEPENDENT_AMBULATORY_CARE_PROVIDER_SITE_OTHER): Payer: Medicare Other | Admitting: Pharmacist

## 2019-05-06 DIAGNOSIS — I480 Paroxysmal atrial fibrillation: Secondary | ICD-10-CM

## 2019-05-06 LAB — POCT INR: INR: 2.5 (ref 2.0–3.0)

## 2019-05-06 NOTE — Patient Instructions (Signed)
Description   Continue taking 5mg  of warfarin daily except 2.5mg  on Saturdays. Repeat INR in 6 weeks.

## 2019-05-22 ENCOUNTER — Other Ambulatory Visit: Payer: Self-pay | Admitting: Cardiology

## 2019-05-22 DIAGNOSIS — Z950 Presence of cardiac pacemaker: Secondary | ICD-10-CM

## 2019-05-22 DIAGNOSIS — Z7901 Long term (current) use of anticoagulants: Secondary | ICD-10-CM

## 2019-05-22 DIAGNOSIS — I442 Atrioventricular block, complete: Secondary | ICD-10-CM

## 2019-05-22 DIAGNOSIS — I25119 Atherosclerotic heart disease of native coronary artery with unspecified angina pectoris: Secondary | ICD-10-CM

## 2019-05-22 DIAGNOSIS — I951 Orthostatic hypotension: Secondary | ICD-10-CM

## 2019-05-22 DIAGNOSIS — I10 Essential (primary) hypertension: Secondary | ICD-10-CM

## 2019-05-22 DIAGNOSIS — E782 Mixed hyperlipidemia: Secondary | ICD-10-CM

## 2019-06-06 DIAGNOSIS — R8281 Pyuria: Secondary | ICD-10-CM

## 2019-06-06 HISTORY — DX: Pyuria: R82.81

## 2019-06-24 ENCOUNTER — Other Ambulatory Visit: Payer: Self-pay

## 2019-06-24 ENCOUNTER — Ambulatory Visit (INDEPENDENT_AMBULATORY_CARE_PROVIDER_SITE_OTHER): Payer: Medicare Other | Admitting: Pharmacist

## 2019-06-24 DIAGNOSIS — I480 Paroxysmal atrial fibrillation: Secondary | ICD-10-CM

## 2019-06-24 DIAGNOSIS — Z7901 Long term (current) use of anticoagulants: Secondary | ICD-10-CM

## 2019-06-24 LAB — POCT INR: INR: 3.6 — AB (ref 2.0–3.0)

## 2019-06-24 NOTE — Patient Instructions (Signed)
Description   Hold tonights dose. Then continue taking 5mg  of warfarin daily except 2.5mg  on Saturdays. Repeat INR in 6 weeks.

## 2019-07-01 ENCOUNTER — Ambulatory Visit (INDEPENDENT_AMBULATORY_CARE_PROVIDER_SITE_OTHER): Payer: Medicare Other | Admitting: *Deleted

## 2019-07-01 ENCOUNTER — Other Ambulatory Visit: Payer: Self-pay

## 2019-07-01 DIAGNOSIS — I480 Paroxysmal atrial fibrillation: Secondary | ICD-10-CM

## 2019-07-01 LAB — POCT INR: INR: 3 (ref 2.0–3.0)

## 2019-07-01 NOTE — Patient Instructions (Signed)
Continue warfarin 5mg  daily except 2.5mg  on Saturdays. Repeat INR in 6 weeks.

## 2019-07-25 ENCOUNTER — Ambulatory Visit (INDEPENDENT_AMBULATORY_CARE_PROVIDER_SITE_OTHER): Payer: Medicare Other | Admitting: *Deleted

## 2019-07-25 DIAGNOSIS — I429 Cardiomyopathy, unspecified: Secondary | ICD-10-CM | POA: Diagnosis not present

## 2019-07-25 DIAGNOSIS — I442 Atrioventricular block, complete: Secondary | ICD-10-CM

## 2019-07-25 LAB — CUP PACEART REMOTE DEVICE CHECK
Battery Remaining Longevity: 118 mo
Battery Remaining Percentage: 95.5 %
Battery Voltage: 3.02 V
Brady Statistic AS VP Percent: 97 %
Brady Statistic AS VS Percent: 1 %
Brady Statistic RV Percent Paced: 99 %
Date Time Interrogation Session: 20210716020013
Implantable Lead Implant Date: 19980122
Implantable Lead Implant Date: 20040315
Implantable Lead Location: 753859
Implantable Lead Location: 753860
Implantable Pulse Generator Implant Date: 20201016
Lead Channel Impedance Value: 390 Ohm
Lead Channel Impedance Value: 630 Ohm
Lead Channel Pacing Threshold Amplitude: 0.75 V
Lead Channel Pacing Threshold Pulse Width: 0.5 ms
Lead Channel Sensing Intrinsic Amplitude: 1.3 mV
Lead Channel Sensing Intrinsic Amplitude: 11.1 mV
Lead Channel Setting Pacing Amplitude: 2.5 V
Lead Channel Setting Pacing Pulse Width: 0.5 ms
Lead Channel Setting Sensing Sensitivity: 4 mV
Pulse Gen Model: 2272
Pulse Gen Serial Number: 9168465

## 2019-07-28 NOTE — Progress Notes (Signed)
Remote pacemaker transmission.   

## 2019-08-12 ENCOUNTER — Ambulatory Visit (INDEPENDENT_AMBULATORY_CARE_PROVIDER_SITE_OTHER): Payer: Medicare Other

## 2019-08-12 ENCOUNTER — Other Ambulatory Visit: Payer: Self-pay

## 2019-08-12 DIAGNOSIS — Z5181 Encounter for therapeutic drug level monitoring: Secondary | ICD-10-CM

## 2019-08-12 DIAGNOSIS — I480 Paroxysmal atrial fibrillation: Secondary | ICD-10-CM | POA: Diagnosis not present

## 2019-08-12 LAB — POCT INR: INR: 3.2 — AB (ref 2.0–3.0)

## 2019-08-12 NOTE — Patient Instructions (Signed)
Hold today and then continue warfarin 5mg  daily except 2.5mg  on Saturdays. Repeat INR in 6 weeks.

## 2019-09-23 ENCOUNTER — Other Ambulatory Visit: Payer: Self-pay

## 2019-09-23 ENCOUNTER — Ambulatory Visit (INDEPENDENT_AMBULATORY_CARE_PROVIDER_SITE_OTHER): Payer: Medicare Other

## 2019-09-23 DIAGNOSIS — I480 Paroxysmal atrial fibrillation: Secondary | ICD-10-CM | POA: Diagnosis not present

## 2019-09-23 DIAGNOSIS — Z5181 Encounter for therapeutic drug level monitoring: Secondary | ICD-10-CM

## 2019-09-23 LAB — POCT INR: INR: 4.2 — AB (ref 2.0–3.0)

## 2019-09-23 NOTE — Patient Instructions (Signed)
Hold today and then decrease warfarin to 1 tablet daily except 0.5 tablet on Saturdays and Sundays. Repeat INR in 4 weeks.

## 2019-10-01 ENCOUNTER — Other Ambulatory Visit: Payer: Self-pay | Admitting: Cardiology

## 2019-10-01 DIAGNOSIS — Z950 Presence of cardiac pacemaker: Secondary | ICD-10-CM

## 2019-10-01 DIAGNOSIS — I951 Orthostatic hypotension: Secondary | ICD-10-CM

## 2019-10-01 DIAGNOSIS — E782 Mixed hyperlipidemia: Secondary | ICD-10-CM

## 2019-10-01 DIAGNOSIS — Z7901 Long term (current) use of anticoagulants: Secondary | ICD-10-CM

## 2019-10-01 DIAGNOSIS — I25119 Atherosclerotic heart disease of native coronary artery with unspecified angina pectoris: Secondary | ICD-10-CM

## 2019-10-01 DIAGNOSIS — I442 Atrioventricular block, complete: Secondary | ICD-10-CM

## 2019-10-01 DIAGNOSIS — I10 Essential (primary) hypertension: Secondary | ICD-10-CM

## 2019-10-02 NOTE — Telephone Encounter (Signed)
Refill Request.  

## 2019-10-21 ENCOUNTER — Other Ambulatory Visit: Payer: Self-pay

## 2019-10-21 ENCOUNTER — Ambulatory Visit (INDEPENDENT_AMBULATORY_CARE_PROVIDER_SITE_OTHER): Payer: Medicare Other

## 2019-10-21 DIAGNOSIS — I480 Paroxysmal atrial fibrillation: Secondary | ICD-10-CM

## 2019-10-21 DIAGNOSIS — Z5181 Encounter for therapeutic drug level monitoring: Secondary | ICD-10-CM | POA: Diagnosis not present

## 2019-10-21 LAB — POCT INR: INR: 6.2 — AB (ref 2.0–3.0)

## 2019-10-21 NOTE — Patient Instructions (Signed)
Hold today, tomorrow and Thursday then decrease warfarin to 1/2 tablet daily except 1 tablet Monday, Wednesday and Friday Repeat INR in 2 weeks.

## 2019-10-22 DIAGNOSIS — B159 Hepatitis A without hepatic coma: Secondary | ICD-10-CM | POA: Insufficient documentation

## 2019-10-24 ENCOUNTER — Ambulatory Visit (INDEPENDENT_AMBULATORY_CARE_PROVIDER_SITE_OTHER): Payer: Medicare Other

## 2019-10-24 DIAGNOSIS — I442 Atrioventricular block, complete: Secondary | ICD-10-CM | POA: Diagnosis not present

## 2019-10-25 LAB — CUP PACEART REMOTE DEVICE CHECK
Battery Remaining Longevity: 116 mo
Battery Remaining Percentage: 95.5 %
Battery Voltage: 3.01 V
Brady Statistic AS VP Percent: 97 %
Brady Statistic AS VS Percent: 1 %
Brady Statistic RV Percent Paced: 99 %
Date Time Interrogation Session: 20211015020014
Implantable Lead Implant Date: 19980122
Implantable Lead Implant Date: 20040315
Implantable Lead Location: 753859
Implantable Lead Location: 753860
Implantable Pulse Generator Implant Date: 20201016
Lead Channel Impedance Value: 380 Ohm
Lead Channel Impedance Value: 610 Ohm
Lead Channel Pacing Threshold Amplitude: 0.75 V
Lead Channel Pacing Threshold Pulse Width: 0.5 ms
Lead Channel Sensing Intrinsic Amplitude: 1.9 mV
Lead Channel Sensing Intrinsic Amplitude: 12 mV
Lead Channel Setting Pacing Amplitude: 2.5 V
Lead Channel Setting Pacing Pulse Width: 0.5 ms
Lead Channel Setting Sensing Sensitivity: 4 mV
Pulse Gen Model: 2272
Pulse Gen Serial Number: 9168465

## 2019-10-26 NOTE — Progress Notes (Signed)
Cardiology Office Note:    Date:  10/27/2019   ID:  [name], DOB December 09, 1925, MRN [id number]  PCP:  Greg Oconnor., MD  Cardiologist:  [name], MD    Referring MD: Greg Oconnor., MD    ASSESSMENT:    1. Paroxysmal atrial fibrillation (HCC)   2. Anticoagulant long-term use   3. Pacemaker   4. Coronary artery disease involving native coronary artery of native heart with angina pectoris (HCC)   5. Mixed hyperlipidemia   6. Hypertensive heart disease without heart failure   7. Orthostatic hypotension    PLAN:    In order of problems listed above:  1. Mr. Greg Oconnor is done well no clinical recurrence of atrial fibrillation stable pacemaker function and continue anticoagulation with warfarin.  I offered transition to a direct anticoagulant again he declines.  Goal INR 2.5 2. Stable pacemaker function followed by device clinic 3. Stable CAD continue medical therapy having no angina has not needed nitroglycerin and I would not advise an ischemia evaluation with his age and comorbidities 4. Continue nonstatin treatment check liver function lipid profile 5. Stable he manages blood pressure with home checks and our goal is a systolic at home 1 40-1 50 systolic to avoid symptomatic orthostatic hypotension and trauma   Next appointment: 6 months   Medication Adjustments/Labs and Tests Ordered: Current medicines are reviewed at length with the patient today.  Concerns regarding medicines are outlined above.  Orders Placed This Encounter  Procedures  . Comprehensive metabolic panel  . Lipid panel   No orders of the defined types were placed in this encounter.   No chief complaint on file.   History of Present Illness:    Greg Oconnor is a 86 y.o. male with a hx of CAD with remote bypass surgery 2005 hypertension orthostatic hypotension hyperlipidemia heart block with permanent pacemaker and chronic anticoagulation.  He is statin intolerant.  He was last seen  04/28/2019.  Compliance with diet, lifestyle and medications: Yes  Overall he is done well he has had trouble with severe symptomatic orthostatic hypotension checks his home blood pressure titrates his dose of ACE inhibitor and almost without exception is less than 1 40-1 50 systolic which is target for him at age 27.  He continues to live independently and is active at his property as a large vegetable garden and has had no syncope chest pain shortness of breath edema or palpitation.  He is on warfarin without bleeding complication and he declines transition to a direct anticoagulant.  Recent labs [facility] PCP 06/04/2019 cholesterol 149 LDL 98 triglyceride 152 HDL 42 CMP with creatinine 1.37 potassium 4.5 GFR 45 cc stage III CKD and normal liver function test.  CBC was normal hemoglobin 12.4.  His pacemaker is followed by device clinic last download 10/24/2019 showed normal device function stable parameters and no sustained arrhythmia. Past Medical History:  Diagnosis Date  . Anemia, chronic disease 05/12/2016  . Anticoagulant long-term use 07/19/2015  . Anticoagulated 06/17/2014  . Atrioventricular block, complete (HCC) 12/31/2014  . 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 BPH (benign prostatic hyperplasia) 07/20/2015  . CAD, multiple vessel 07/20/2015  . Cardiomyopathy (HCC) 07/20/2015  . Chronic diarrhea 11/20/2018  . Coronary artery disease involving native coronary artery of native heart with angina pectoris (HCC) 07/20/2015  . Diabetes mellitus with stage 3 chronic kidney disease (HCC) 07/20/2015  . Essential hypertension 07/20/2015  . GERD without esophagitis 07/20/2015  .  Hepatitis A   . History of pulmonary embolism 07/20/2015  . Hypertensive heart disease without heart failure 06/17/2014  . Mixed hyperlipidemia 07/20/2015  . Old myocardial infarction 07/20/2015  . 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 Orthostatic hypotension 07/20/2015  . Pacemaker  06/17/2014   OLD ATRIAL LEAD failure  . Pacemaker reprogramming/check 06/17/2014   Formatting of this note might be different from the original. OLD ATRIAL LEAD failure  . Paroxysmal atrial fibrillation (HCC) 05/31/2015  . Pyuria 06/06/2019  . Stage 3 chronic kidney disease (HCC) 07/20/2015  . Statin intolerance 10/13/2018  . Subconjunctival hemorrhage 07/20/2015  . Type 2 diabetes mellitus without complication, without long-term current use of insulin (HCC) 07/20/2015  . Vitamin B12 deficiency 07/20/2015    Past Surgical History:  Procedure Laterality Date  . APPENDECTOMY    . CHOLECYSTECTOMY    . CORONARY ARTERY BYPASS GRAFT    . CYSTOSCOPY    . INSERT / REPLACE / REMOVE PACEMAKER     [employer]. Jude  . PPM GENERATOR CHANGEOUT N/A 10/25/2018   Procedure: PPM GENERATOR CHANGEOUT;  Surgeon: Regan Lemming, MD;  Location: [facility];  Service: Cardiovascular;  Laterality: N/A;  . PROSTATECTOMY      Current Medications: Current Meds  Medication Sig  . acetaminophen (TYLENOL) 500 MG tablet Take 500 mg by mouth every 6 (six) hours as needed for moderate pain or headache.  . cetirizine (ZYRTEC) 10 MG tablet Take 10 mg by mouth daily as needed for allergies.   . cholestyramine (QUESTRAN) 4 GM/DOSE powder Take 4 g by mouth daily as needed (loose stools).   . folic acid (FOLVITE) 800 MCG tablet Take 800 mcg by mouth daily.  Marland Kitchen lisinopril (ZESTRIL) 20 MG tablet Take 10-20 mg by mouth daily as needed (if systolic bp is 135 or greater).   . nitroGLYCERIN (NITROSTAT) 0.4 MG SL tablet Place 1 tablet (0.4 mg total) under the tongue every 5 (five) minutes as needed for chest pain.  . Simethicone (GAS-X EXTRA STRENGTH) 125 MG CAPS Take 125 mg by mouth daily as needed (gas).  . sodium chloride (OCEAN) 0.65 % SOLN nasal spray Place 1 spray into both nostrils as needed for congestion.  . trolamine salicylate (ASPERCREME) 10 % cream Apply 1 application topically as needed for muscle pain.  . vitamin  B-12 (CYANOCOBALAMIN) 500 MCG tablet Take 500 mcg by mouth daily.  Marland Kitchen warfarin (COUMADIN) 5 MG tablet TAKE 1/2 TO 1 (ONE-HALF TO ONE) TABLET BY MOUTH DAILY AS DIRECTED BY COUMADIN CLINIC     Allergies:   Ezetimibe; Fenofibrate micronized; Fish oil; Niacin; Simvastatin; and Tetanus toxoid, adsorbed   Social History   Socioeconomic History  . Marital status: Married    Spouse name: Not on file  . Number of children: Not on file  . Years of education: Not on file  . Highest education level: Not on file  Occupational History  . Not on file  Tobacco Use  . Smoking status: Former Smoker    Types: Cigarettes  . Smokeless tobacco: Never Used  Vaping Use  . Vaping Use: Never used  Substance and Sexual Activity  . Alcohol use: Not Currently  . Drug use: Never  . Sexual activity: Not on file  Other Topics Concern  . Not on file  Social History Narrative  . Not on file   Social Determinants of Health   Financial Resource Strain:   . Difficulty of Paying Living Expenses: Not on file  Food Insecurity:   .  Worried About Programme researcher, broadcasting/film/video in the Last Year: Not on file  . Ran Out of Food in the Last Year: Not on file  Transportation Needs:   . Lack of Transportation (Medical): Not on file  . Lack of Transportation (Non-Medical): Not on file  Physical Activity:   . Days of Exercise per Week: Not on file  . Minutes of Exercise per Session: Not on file  Stress:   . Feeling of Stress : Not on file  Social Connections:   . Frequency of Communication with Friends and Family: Not on file  . Frequency of Social Gatherings with Friends and Family: Not on file  . Attends Religious Services: Not on file  . Active Member of Clubs or Organizations: Not on file  . Attends Banker Meetings: Not on file  . Marital Status: Not on file     Family History: The patient's family history includes Cancer in his father; Heart Problems in his brother and mother; Parkinson's disease in his  brother; Stroke in his mother. ROS:   Please see the history of present illness.    All other systems reviewed and are negative.  EKGs/Labs/Other Studies Reviewed:    The following studies were reviewed today:   Physical Exam:    VS:  BP (!) 172/80   Pulse (!) 104   Ht 5\' 7"  (1.702 m)   Wt 139 lb 3.2 oz (63.1 kg)   SpO2 99%   BMI 21.80 kg/m     Wt Readings from Last 3 Encounters:  10/27/19 139 lb 3.2 oz (63.1 kg)  04/28/19 138 lb (62.6 kg)  01/27/19 139 lb 3.2 oz (63.1 kg)     GEN:  Well nourished, well developed in no acute distress he looks younger than his age HEENT: Normal NECK: No JVD; No carotid bruits LYMPHATICS: No lymphadenopathy CARDIAC: RRR, no murmurs, rubs, gallops RESPIRATORY:  Clear to auscultation without rales, wheezing or rhonchi  ABDOMEN: Soft, non-tender, non-distended MUSCULOSKELETAL:  No edema; No deformity  SKIN: Warm and dry NEUROLOGIC:  Alert and oriented x 3 PSYCHIATRIC:  Normal affect    Signed, 01/29/19, MD  10/27/2019 10:26 AM    [facility]

## 2019-10-27 ENCOUNTER — Encounter: Payer: Self-pay | Admitting: Cardiology

## 2019-10-27 ENCOUNTER — Other Ambulatory Visit: Payer: Self-pay

## 2019-10-27 ENCOUNTER — Ambulatory Visit (INDEPENDENT_AMBULATORY_CARE_PROVIDER_SITE_OTHER): Payer: Medicare Other | Admitting: Cardiology

## 2019-10-27 VITALS — BP 172/80 | HR 104 | Ht 67.0 in | Wt 139.2 lb

## 2019-10-27 DIAGNOSIS — Z950 Presence of cardiac pacemaker: Secondary | ICD-10-CM

## 2019-10-27 DIAGNOSIS — E782 Mixed hyperlipidemia: Secondary | ICD-10-CM

## 2019-10-27 DIAGNOSIS — I951 Orthostatic hypotension: Secondary | ICD-10-CM

## 2019-10-27 DIAGNOSIS — I25119 Atherosclerotic heart disease of native coronary artery with unspecified angina pectoris: Secondary | ICD-10-CM | POA: Diagnosis not present

## 2019-10-27 DIAGNOSIS — I480 Paroxysmal atrial fibrillation: Secondary | ICD-10-CM

## 2019-10-27 DIAGNOSIS — I119 Hypertensive heart disease without heart failure: Secondary | ICD-10-CM

## 2019-10-27 DIAGNOSIS — Z7901 Long term (current) use of anticoagulants: Secondary | ICD-10-CM

## 2019-10-27 LAB — COMPREHENSIVE METABOLIC PANEL
ALT: 14 IU/L (ref 0–44)
AST: 17 IU/L (ref 0–40)
Albumin/Globulin Ratio: 1.7 (ref 1.2–2.2)
Albumin: 4.3 g/dL (ref 3.5–4.6)
Alkaline Phosphatase: 68 IU/L (ref 44–121)
BUN/Creatinine Ratio: 21 (ref 10–24)
BUN: 20 mg/dL (ref 10–36)
Bilirubin Total: 1.3 mg/dL — ABNORMAL HIGH (ref 0.0–1.2)
CO2: 23 mmol/L (ref 20–29)
Calcium: 9.2 mg/dL (ref 8.6–10.2)
Chloride: 107 mmol/L — ABNORMAL HIGH (ref 96–106)
Creatinine, Ser: 0.95 mg/dL (ref 0.76–1.27)
GFR calc Af Amer: 81 mL/min/{1.73_m2} (ref >59)
GFR calc non Af Amer: 70 mL/min/{1.73_m2} (ref >59)
Globulin, Total: 2.5 g/dL (ref 1.5–4.5)
Glucose: 109 mg/dL — ABNORMAL HIGH (ref 65–99)
Potassium: 4.4 mmol/L (ref 3.5–5.2)
Sodium: 142 mmol/L (ref 134–144)
Total Protein: 6.8 g/dL (ref 6.0–8.5)

## 2019-10-27 LAB — LIPID PANEL
Chol/HDL Ratio: 3.2 ratio (ref 0.0–5.0)
Cholesterol, Total: 144 mg/dL (ref 100–199)
HDL: 45 mg/dL (ref >39)
LDL Chol Calc (NIH): 83 mg/dL (ref 0–99)
Triglycerides: 83 mg/dL (ref 0–149)
VLDL Cholesterol Cal: 16 mg/dL (ref 5–40)

## 2019-10-27 NOTE — Patient Instructions (Signed)

## 2019-10-28 ENCOUNTER — Telehealth: Payer: Self-pay

## 2019-10-28 NOTE — Telephone Encounter (Signed)
Spoke with patient regarding results and recommendation.  Patient verbalizes understanding and is agreeable to plan of care. Advised patient to call back with any issues or concerns.  

## 2019-10-29 NOTE — Progress Notes (Signed)
Remote pacemaker transmission.   

## 2019-11-04 ENCOUNTER — Other Ambulatory Visit: Payer: Self-pay

## 2019-11-04 ENCOUNTER — Ambulatory Visit (INDEPENDENT_AMBULATORY_CARE_PROVIDER_SITE_OTHER): Payer: Medicare Other | Admitting: Pharmacist

## 2019-11-04 DIAGNOSIS — I480 Paroxysmal atrial fibrillation: Secondary | ICD-10-CM | POA: Diagnosis not present

## 2019-11-04 LAB — POCT INR: INR: 2.2 (ref 2.0–3.0)

## 2019-11-04 NOTE — Patient Instructions (Signed)
Take 1 tablet today then continue warfarin to 1/2 tablet daily except 1 tablet Monday, Wednesday and Friday. Repeat INR in 2 weeks.

## 2019-11-18 ENCOUNTER — Other Ambulatory Visit: Payer: Self-pay

## 2019-11-18 ENCOUNTER — Ambulatory Visit (INDEPENDENT_AMBULATORY_CARE_PROVIDER_SITE_OTHER): Payer: Medicare Other

## 2019-11-18 DIAGNOSIS — I480 Paroxysmal atrial fibrillation: Secondary | ICD-10-CM | POA: Diagnosis not present

## 2019-11-18 DIAGNOSIS — Z5181 Encounter for therapeutic drug level monitoring: Secondary | ICD-10-CM

## 2019-11-18 LAB — POCT INR: INR: 2.2 (ref 2.0–3.0)

## 2019-11-18 NOTE — Patient Instructions (Signed)
Increase to 1 tablet daily except 1/2 tablet Monday, Wednesday and Friday. Repeat INR in 2 weeks.

## 2019-12-02 ENCOUNTER — Ambulatory Visit (INDEPENDENT_AMBULATORY_CARE_PROVIDER_SITE_OTHER): Payer: Medicare Other

## 2019-12-02 ENCOUNTER — Other Ambulatory Visit: Payer: Self-pay

## 2019-12-02 DIAGNOSIS — I480 Paroxysmal atrial fibrillation: Secondary | ICD-10-CM | POA: Diagnosis not present

## 2019-12-02 DIAGNOSIS — Z5181 Encounter for therapeutic drug level monitoring: Secondary | ICD-10-CM | POA: Diagnosis not present

## 2019-12-02 LAB — POCT INR: INR: 2 (ref 2.0–3.0)

## 2019-12-02 NOTE — Patient Instructions (Signed)
Take 2 tablets tonight and then continue 1 tablet daily except 1/2 tablet Monday, Wednesday and Friday. Repeat INR in 2 weeks.

## 2019-12-16 ENCOUNTER — Other Ambulatory Visit: Payer: Self-pay

## 2019-12-16 ENCOUNTER — Ambulatory Visit (INDEPENDENT_AMBULATORY_CARE_PROVIDER_SITE_OTHER): Payer: Medicare Other

## 2019-12-16 DIAGNOSIS — Z5181 Encounter for therapeutic drug level monitoring: Secondary | ICD-10-CM | POA: Diagnosis not present

## 2019-12-16 DIAGNOSIS — I480 Paroxysmal atrial fibrillation: Secondary | ICD-10-CM | POA: Diagnosis not present

## 2019-12-16 LAB — POCT INR: INR: 2.3 (ref 2.0–3.0)

## 2019-12-16 NOTE — Patient Instructions (Signed)
Increase to 1 tablet daily except 1/2 tablet Monday and Friday. Repeat INR in 3 weeks.

## 2020-01-08 ENCOUNTER — Ambulatory Visit (INDEPENDENT_AMBULATORY_CARE_PROVIDER_SITE_OTHER): Payer: Medicare Other

## 2020-01-08 ENCOUNTER — Other Ambulatory Visit: Payer: Self-pay

## 2020-01-08 DIAGNOSIS — Z5181 Encounter for therapeutic drug level monitoring: Secondary | ICD-10-CM

## 2020-01-08 DIAGNOSIS — I480 Paroxysmal atrial fibrillation: Secondary | ICD-10-CM | POA: Diagnosis not present

## 2020-01-08 LAB — POCT INR: INR: 2.5 (ref 2.0–3.0)

## 2020-01-08 NOTE — Patient Instructions (Signed)
Continue taking 1 tablet daily except 1/2 tablet Monday and Friday. Repeat INR in 4 weeks.

## 2020-01-19 ENCOUNTER — Telehealth: Payer: Self-pay | Admitting: Cardiology

## 2020-01-19 DIAGNOSIS — I951 Orthostatic hypotension: Secondary | ICD-10-CM

## 2020-01-19 DIAGNOSIS — E782 Mixed hyperlipidemia: Secondary | ICD-10-CM

## 2020-01-19 DIAGNOSIS — Z7901 Long term (current) use of anticoagulants: Secondary | ICD-10-CM

## 2020-01-19 DIAGNOSIS — I10 Essential (primary) hypertension: Secondary | ICD-10-CM

## 2020-01-19 DIAGNOSIS — I25119 Atherosclerotic heart disease of native coronary artery with unspecified angina pectoris: Secondary | ICD-10-CM

## 2020-01-19 DIAGNOSIS — I442 Atrioventricular block, complete: Secondary | ICD-10-CM

## 2020-01-19 DIAGNOSIS — Z950 Presence of cardiac pacemaker: Secondary | ICD-10-CM

## 2020-01-19 MED ORDER — WARFARIN SODIUM 5 MG PO TABS: ORAL_TABLET | ORAL | 0 refills | Status: DC

## 2020-01-19 NOTE — Telephone Encounter (Signed)
*  STAT* If patient is at the pharmacy, call can be transferred to refill team.   1. Which medications need to be refilled? (please list name of each medication and dose if known) warfarin (COUMADIN) 5 MG tablet  2. Which pharmacy/location (including street and city if local pharmacy) is medication to be sent to? [facility] - [place], [place] - [address]  3. Do they need a 30 day or 90 day supply? 90

## 2020-01-23 ENCOUNTER — Ambulatory Visit (INDEPENDENT_AMBULATORY_CARE_PROVIDER_SITE_OTHER): Payer: Medicare HMO

## 2020-01-23 DIAGNOSIS — I442 Atrioventricular block, complete: Secondary | ICD-10-CM

## 2020-01-26 ENCOUNTER — Telehealth: Payer: BLUE CROSS/BLUE SHIELD | Admitting: Cardiology

## 2020-01-26 LAB — CUP PACEART REMOTE DEVICE CHECK
Battery Remaining Longevity: 124 mo
Battery Remaining Percentage: 95.5 %
Battery Voltage: 3.01 V
Brady Statistic AS VP Percent: 97 %
Brady Statistic AS VS Percent: 1 %
Brady Statistic RV Percent Paced: 99 %
Date Time Interrogation Session: 20220114020013
Implantable Lead Implant Date: 19980122
Implantable Lead Implant Date: 20040315
Implantable Lead Location: 753859
Implantable Lead Location: 753860
Implantable Pulse Generator Implant Date: 20201016
Lead Channel Impedance Value: 390 Ohm
Lead Channel Impedance Value: 630 Ohm
Lead Channel Pacing Threshold Amplitude: 0.75 V
Lead Channel Pacing Threshold Pulse Width: 0.5 ms
Lead Channel Sensing Intrinsic Amplitude: 1.8 mV
Lead Channel Sensing Intrinsic Amplitude: 12 mV
Lead Channel Setting Pacing Amplitude: 2.5 V
Lead Channel Setting Pacing Pulse Width: 0.5 ms
Lead Channel Setting Sensing Sensitivity: 4 mV
Pulse Gen Model: 2272
Pulse Gen Serial Number: 9168465

## 2020-02-03 ENCOUNTER — Ambulatory Visit (INDEPENDENT_AMBULATORY_CARE_PROVIDER_SITE_OTHER): Payer: Medicare HMO

## 2020-02-03 ENCOUNTER — Other Ambulatory Visit: Payer: Self-pay

## 2020-02-03 DIAGNOSIS — Z5181 Encounter for therapeutic drug level monitoring: Secondary | ICD-10-CM

## 2020-02-03 DIAGNOSIS — I480 Paroxysmal atrial fibrillation: Secondary | ICD-10-CM

## 2020-02-03 LAB — POCT INR: INR: 3 (ref 2.0–3.0)

## 2020-02-03 NOTE — Patient Instructions (Signed)
Continue taking 1 tablet daily except 1/2 tablet Monday and Friday. Repeat INR in 6 weeks.

## 2020-02-07 NOTE — Progress Notes (Signed)
Remote pacemaker transmission.   

## 2020-03-16 ENCOUNTER — Other Ambulatory Visit: Payer: Self-pay

## 2020-03-16 ENCOUNTER — Ambulatory Visit (INDEPENDENT_AMBULATORY_CARE_PROVIDER_SITE_OTHER): Payer: Medicare HMO

## 2020-03-16 DIAGNOSIS — I480 Paroxysmal atrial fibrillation: Secondary | ICD-10-CM | POA: Diagnosis not present

## 2020-03-16 DIAGNOSIS — Z5181 Encounter for therapeutic drug level monitoring: Secondary | ICD-10-CM | POA: Diagnosis not present

## 2020-03-16 LAB — POCT INR: INR: 2.9 (ref 2.0–3.0)

## 2020-03-16 NOTE — Patient Instructions (Signed)
Continue taking 1 tablet daily except 1/2 tablet Monday and Friday. Repeat INR in 6 weeks. 

## 2020-03-29 ENCOUNTER — Encounter: Payer: Self-pay | Admitting: Cardiology

## 2020-03-29 ENCOUNTER — Ambulatory Visit (INDEPENDENT_AMBULATORY_CARE_PROVIDER_SITE_OTHER): Payer: Medicare HMO | Admitting: Cardiology

## 2020-03-29 ENCOUNTER — Other Ambulatory Visit: Payer: Self-pay

## 2020-03-29 VITALS — BP 148/78 | HR 94 | Ht 67.5 in | Wt 137.2 lb

## 2020-03-29 DIAGNOSIS — I442 Atrioventricular block, complete: Secondary | ICD-10-CM

## 2020-03-29 DIAGNOSIS — I480 Paroxysmal atrial fibrillation: Secondary | ICD-10-CM

## 2020-03-29 NOTE — Patient Instructions (Signed)
Medication Instructions:  Your physician recommends that you continue on your current medications as directed. Please refer to the Current Medication list given to you today.  *If you need a refill on your cardiac medications before your next appointment, please call your pharmacy*   Lab Work: None ordered If you have labs (blood work) drawn today and your tests are completely normal, you will receive your results only by: Marland Kitchen MyChart Message (if you have MyChart) OR . A paper copy in the mail If you have any lab test that is abnormal or we need to change your treatment, we will call you to review the results.   Testing/Procedures: None ordered   Follow-Up: At [facility], you and your health needs are our priority.  As part of our continuing mission to provide you with exceptional heart care, we have created designated Provider Care Teams.  These Care Teams include your primary Cardiologist (physician) and Advanced Practice Providers (APPs -  Physician Assistants and Nurse Practitioners) who all work together to provide you with the care you need, when you need it.  We recommend signing up for the patient portal called "MyChart".  Sign up information is provided on this After Visit Summary.  MyChart is used to connect with patients for Virtual Visits (Telemedicine).  Patients are able to view lab/test results, encounter notes, upcoming appointments, etc.  Non-urgent messages can be sent to your provider as well.   To learn more about what you can do with MyChart, go to [email or web address].    Remote monitoring is used to monitor your Pacemaker or ICD from home. This monitoring reduces the number of office visits required to check your device to one time per year. It allows [place] to keep an eye on the functioning of your device to ensure it is working properly. You are scheduled for a device check from home on 04/23/2020. You may send your transmission at any time that day. If you have a  wireless device, the transmission will be sent automatically. After your physician reviews your transmission, you will receive a postcard with your next transmission date.  Your next appointment:   1 year(s)  The format for your next appointment:   In Person  Provider:   Loman Brooklyn, MD   Thank you for choosing [facility]!!   Dory Horn, RN [phone]    Other Instructions

## 2020-03-29 NOTE — Progress Notes (Signed)
Electrophysiology Office Note   Date:  03/29/2020   ID:  Greg Oconnor, DOB 1925-11-13, MRN [id number]  PCP:  Greg Payment., MD  Cardiologist:  Dulce Sellar Primary Electrophysiologist:  Maria Gallicchio Jorja Loa, MD    No chief complaint on file.    History of Present Illness: Greg Oconnor is a 87 y.o. male who is being seen today for the evaluation of cardiomyopathy at the request of Greg Oconnor. Presenting today for electrophysiology evaluation.    He has a history of coronary artery disease, hypertension, hyperlipidemia, complete heart block status post [facility] Jude dual-chamber pacemaker.    Today, denies symptoms of palpitations, chest pain, shortness of breath, orthopnea, PND, lower extremity edema, claudication, dizziness, presyncope, syncope, bleeding, or neurologic sequela. The patient is tolerating medications without difficulties.  Since being seen he has done well.  No chest pain or shortness of breath.  Is able do all of his daily activities.  He does state that he gets cold from time to time.  At this point, he would like to continue his warfarin instead of other anticoagulants.  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 07/19/2015   BPH (benign prostatic hyperplasia) 07/20/2015   CAD, multiple vessel 07/20/2015   Cardiomyopathy (HCC) 07/20/2015   Chronic diarrhea 11/20/2018   Coronary artery disease involving native coronary artery of native heart with angina pectoris (HCC) 07/20/2015   Diabetes mellitus with stage 3 chronic kidney disease (HCC) 07/20/2015   Essential hypertension 07/20/2015   GERD without esophagitis 07/20/2015   Hepatitis A    History of pulmonary embolism 07/20/2015   Hypertensive heart disease without heart failure 06/17/2014    Mixed hyperlipidemia 07/20/2015   Old myocardial infarction 07/20/2015   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 OLD ATRIAL LEAD failure   Paroxysmal atrial fibrillation (HCC) 05/31/2015   Pyuria 06/06/2019   Stage 3 chronic kidney disease (HCC) 07/20/2015   Statin intolerance 10/13/2018   Subconjunctival hemorrhage 07/20/2015   Type 2 diabetes mellitus without complication, without long-term current use of insulin (HCC) 07/20/2015   Vitamin B12 deficiency 07/20/2015   Past Surgical History:  Procedure Laterality Date   APPENDECTOMY     CHOLECYSTECTOMY     CORONARY ARTERY BYPASS GRAFT     CYSTOSCOPY     INSERT / REPLACE / REMOVE PACEMAKER     St. Jude   PPM GENERATOR CHANGEOUT N/A 10/25/2018   Procedure: PPM GENERATOR CHANGEOUT;  Surgeon: Regan Lemming, MD;  Location: [facility] INVASIVE CV LAB;  Service: Cardiovascular;  Laterality: N/A;   PROSTATECTOMY       Current Outpatient Medications  Medication Sig Dispense Refill   acetaminophen (TYLENOL) 500 MG tablet Take 500 mg by mouth every 6 (six) hours as needed for moderate pain or headache.     cetirizine (ZYRTEC) 10 MG tablet Take 10 mg by mouth daily as needed for allergies.      cholestyramine (QUESTRAN) 4 GM/DOSE powder Take 4 g by mouth daily as needed (loose stools).      folic acid (FOLVITE) 800 MCG tablet Take 800 mcg by mouth daily.     lisinopril (ZESTRIL) 20  MG tablet Take 10-20 mg by mouth daily as needed (if systolic bp is 135 or greater).      nitroGLYCERIN (NITROSTAT) 0.4 MG SL tablet Place 1 tablet (0.4 mg total) under the tongue every 5 (five) minutes as needed for chest pain. 30 tablet 0   Simethicone 125 MG CAPS Take 125 mg by mouth daily as needed (gas).     sodium chloride (OCEAN) 0.65 % SOLN nasal spray Place 1  spray into both nostrils as needed for congestion.     trolamine salicylate (ASPERCREME) 10 % cream Apply 1 application topically as needed for muscle pain.     vitamin B-12 (CYANOCOBALAMIN) 500 MCG tablet Take 500 mcg by mouth daily.     warfarin (COUMADIN) 5 MG tablet TAKE 1/2 TO 1 (ONE-HALF TO ONE) TABLET BY MOUTH DAILY AS DIRECTED BY COUMADIN CLINIC 90 tablet 0   No current facility-administered medications for this visit.    Allergies:   Ezetimibe; Fenofibrate micronized; Fish oil; Niacin; Simvastatin; and Tetanus toxoid, adsorbed   Social History:  The patient  reports that he has quit smoking. His smoking use included cigarettes. He has never used smokeless tobacco. He reports previous alcohol use. He reports that he does not use drugs.   Family History:  The patient's family history includes Cancer in his father; Heart Problems in his brother and mother; Parkinson's disease in his brother; Stroke in his mother.    ROS:  Please see the history of present illness.   Otherwise, review of systems is positive for none.   All other systems are reviewed and negative.   PHYSICAL EXAM: VS:  BP (!) 148/78    Pulse 94    Ht 5' 7.5" (1.715 m)    Wt 137 lb 3.2 oz (62.2 kg)    SpO2 98%    BMI 21.17 kg/m  , BMI Body mass index is 21.17 kg/m. GEN: Well nourished, well developed, in no acute distress  HEENT: normal  Neck: no JVD, carotid bruits, or masses Cardiac: RRR; no murmurs, rubs, or gallops,no edema  Respiratory:  clear to auscultation bilaterally, normal work of breathing GI: soft, nontender, nondistended, + BS MS: no deformity or atrophy  Skin: warm and dry, device site well healed Neuro:  Strength and sensation are intact Psych: euthymic mood, full affect  EKG:  EKG is ordered today. Personal review of the ekg ordered shows atrial sensed, ventricular paced, PVC  Personal review of the device interrogation today. Results in Paceart    Recent Labs: 10/27/2019: ALT 14; BUN 20;  Creatinine, Ser 0.95; Potassium 4.4; Sodium 142    Lipid Panel     Component Value Date/Time   CHOL 144 10/27/2019 1033   TRIG 83 10/27/2019 1033   HDL 45 10/27/2019 1033   CHOLHDL 3.2 10/27/2019 1033   LDLCALC 83 10/27/2019 1033     Wt Readings from Last 3 Encounters:  03/29/20 137 lb 3.2 oz (62.2 kg)  10/27/19 139 lb 3.2 oz (63.1 kg)  04/28/19 138 lb (62.6 kg)      Other studies Reviewed: Additional studies/ records that were reviewed today include: TTE 04/24/2017 Review of the above records today demonstrates:  Ejection fraction 30%.  Left atrial enlargement.  No significant aortic stenosis.  Mild to moderate mitral regurgitation.   ASSESSMENT AND PLAN:  1.  Complete heart block: Status post [employer] Jude dual-chamber pacemaker with generator change 10/25/2018.  Device functioning appropriately.  Has an elevated atrial lead threshold though rarely paces.  No changes.    2.  Coronary artery disease: No current chest pain.  Plan per primary cardiology.    3.  Hypertension: Mildly elevated today.  He does have a history of orthostatic hypotension.  Plan per primary cardiology.  4.  Paroxysmal atrial fibrillation: Currently on warfarin.  CHA2DS2-VASc of 4.  Minimal noted on device interrogation.    Current medicines are reviewed at length with the patient today.   The patient does not have concerns regarding his medicines.  The following changes were made today: none  Labs/ tests ordered today include:  Orders Placed This Encounter  Procedures   EKG 12-Lead     Disposition:   FU with Suzanna Zahn 12 months  Signed, Saphyra Hutt Jorja Loa, MD  03/29/2020 11:20 AM     [facility] [address] [place] [address] [phone] (office) [phone] (fax)

## 2020-04-23 ENCOUNTER — Ambulatory Visit (INDEPENDENT_AMBULATORY_CARE_PROVIDER_SITE_OTHER): Payer: Medicare HMO

## 2020-04-23 DIAGNOSIS — I442 Atrioventricular block, complete: Secondary | ICD-10-CM | POA: Diagnosis not present

## 2020-04-27 ENCOUNTER — Ambulatory Visit (INDEPENDENT_AMBULATORY_CARE_PROVIDER_SITE_OTHER): Payer: Medicare HMO

## 2020-04-27 ENCOUNTER — Other Ambulatory Visit: Payer: Self-pay

## 2020-04-27 DIAGNOSIS — I10 Essential (primary) hypertension: Secondary | ICD-10-CM

## 2020-04-27 DIAGNOSIS — I480 Paroxysmal atrial fibrillation: Secondary | ICD-10-CM | POA: Diagnosis not present

## 2020-04-27 DIAGNOSIS — I442 Atrioventricular block, complete: Secondary | ICD-10-CM

## 2020-04-27 DIAGNOSIS — I25119 Atherosclerotic heart disease of native coronary artery with unspecified angina pectoris: Secondary | ICD-10-CM

## 2020-04-27 DIAGNOSIS — Z5181 Encounter for therapeutic drug level monitoring: Secondary | ICD-10-CM | POA: Diagnosis not present

## 2020-04-27 DIAGNOSIS — Z7901 Long term (current) use of anticoagulants: Secondary | ICD-10-CM

## 2020-04-27 DIAGNOSIS — Z950 Presence of cardiac pacemaker: Secondary | ICD-10-CM

## 2020-04-27 DIAGNOSIS — E782 Mixed hyperlipidemia: Secondary | ICD-10-CM

## 2020-04-27 DIAGNOSIS — I951 Orthostatic hypotension: Secondary | ICD-10-CM

## 2020-04-27 LAB — CUP PACEART REMOTE DEVICE CHECK
Battery Remaining Longevity: 124 mo
Battery Remaining Percentage: 95.5 %
Battery Voltage: 3.01 V
Brady Statistic AS VP Percent: 94 %
Brady Statistic AS VS Percent: 1 %
Brady Statistic RV Percent Paced: 98 %
Date Time Interrogation Session: 20220415020015
Implantable Lead Implant Date: 19980122
Implantable Lead Implant Date: 20040315
Implantable Lead Location: 753859
Implantable Lead Location: 753860
Implantable Pulse Generator Implant Date: 20201016
Lead Channel Impedance Value: 390 Ohm
Lead Channel Impedance Value: 630 Ohm
Lead Channel Pacing Threshold Amplitude: 1 V
Lead Channel Pacing Threshold Pulse Width: 0.5 ms
Lead Channel Sensing Intrinsic Amplitude: 3 mV
Lead Channel Sensing Intrinsic Amplitude: 9.4 mV
Lead Channel Setting Pacing Amplitude: 2.5 V
Lead Channel Setting Pacing Pulse Width: 0.5 ms
Lead Channel Setting Sensing Sensitivity: 4 mV
Pulse Gen Model: 2272
Pulse Gen Serial Number: 9168465

## 2020-04-27 LAB — POCT INR: INR: 1.9 — AB (ref 2.0–3.0)

## 2020-04-27 MED ORDER — WARFARIN SODIUM 5 MG PO TABS: ORAL_TABLET | ORAL | 1 refills | Status: DC

## 2020-04-27 NOTE — Patient Instructions (Signed)
Take 2 tablets tonight only and then Continue taking 1 tablet daily except 1/2 tablet Monday and Friday. Repeat INR in 6 weeks.

## 2020-04-28 ENCOUNTER — Encounter: Payer: Self-pay | Admitting: Cardiology

## 2020-04-28 ENCOUNTER — Ambulatory Visit: Payer: Medicare HMO | Admitting: Cardiology

## 2020-04-28 VITALS — BP 168/80 | HR 100 | Ht 67.5 in | Wt 136.8 lb

## 2020-04-28 DIAGNOSIS — I442 Atrioventricular block, complete: Secondary | ICD-10-CM

## 2020-04-28 DIAGNOSIS — Z950 Presence of cardiac pacemaker: Secondary | ICD-10-CM

## 2020-04-28 DIAGNOSIS — I25119 Atherosclerotic heart disease of native coronary artery with unspecified angina pectoris: Secondary | ICD-10-CM

## 2020-04-28 DIAGNOSIS — I480 Paroxysmal atrial fibrillation: Secondary | ICD-10-CM | POA: Diagnosis not present

## 2020-04-28 DIAGNOSIS — E782 Mixed hyperlipidemia: Secondary | ICD-10-CM

## 2020-04-28 DIAGNOSIS — Z789 Other specified health status: Secondary | ICD-10-CM

## 2020-04-28 DIAGNOSIS — I119 Hypertensive heart disease without heart failure: Secondary | ICD-10-CM

## 2020-04-28 DIAGNOSIS — Z7901 Long term (current) use of anticoagulants: Secondary | ICD-10-CM

## 2020-04-28 MED ORDER — LISINOPRIL 20 MG PO TABS: 10.0000 mg | ORAL_TABLET | Freq: Every day | ORAL | 3 refills | Status: DC | PRN

## 2020-04-28 NOTE — Patient Instructions (Signed)
Medication Instructions:  Your physician recommends that you continue on your current medications as directed. Please refer to the Current Medication list given to you today.  *If you need a refill on your cardiac medications before your next appointment, please call your pharmacy*   Lab Work: Your physician recommends that you return for lab work in: TODAY CBC, CMP, TSH, Lipids If you have labs (blood work) drawn today and your tests are completely normal, you will receive your results only by: Marland Kitchen MyChart Message (if you have MyChart) OR . A paper copy in the mail If you have any lab test that is abnormal or we need to change your treatment, we will call you to review the results.   Testing/Procedures: None   Follow-Up: At [facility], you and your health needs are our priority.  As part of our continuing mission to provide you with exceptional heart care, we have created designated Provider Care Teams.  These Care Teams include your primary Cardiologist (physician) and Advanced Practice Providers (APPs -  Physician Assistants and Nurse Practitioners) who all work together to provide you with the care you need, when you need it.  We recommend signing up for the patient portal called "MyChart".  Sign up information is provided on this After Visit Summary.  MyChart is used to connect with patients for Virtual Visits (Telemedicine).  Patients are able to view lab/test results, encounter notes, upcoming appointments, etc.  Non-urgent messages can be sent to your provider as well.   To learn more about what you can do with MyChart, go to [email or web address].    Your next appointment:   6 month(s)  The format for your next appointment:   In Person  Provider:   [name], MD   Other Instructions

## 2020-04-28 NOTE — Progress Notes (Signed)
Cardiology Office Note:    Date:  04/28/2020   ID:  Greg Oconnor, DOB Jan 03, 1926, MRN [id number]  PCP:  [name], MD  Cardiologist:  [name], MD    Referring MD: [name], MD    ASSESSMENT:    1. Coronary artery disease involving native coronary artery of native heart with angina pectoris (HCC)   2. Atrioventricular block, complete (HCC)   3. Pacemaker   4. Paroxysmal atrial fibrillation (HCC)   5. Anticoagulant long-term use   6. Hypertensive heart disease without heart failure   7. Mixed hyperlipidemia   8. Statin intolerance    PLAN:    In order of problems listed above:  1. Overall he has done well remains asymptomatic since his bypass surgery he is maintained on warfarin with his history of atrial fibrillation antihypertensives is statin intolerant and takes Questran.  At age 67 I would not advise a PCSK9 inhibitor. 2. Stable asymptomatic pacemaker followed in our practice 3. No clinical recurrence recently he has brief mode switching most consistent with atrial tachycardia 4. Continue his current anticoagulant warfarin managed and anticoagulant clinic and has had no bleeding complication 5. At age 40 his BP is in range knowing in the past he has had severe symptomatic orthostatic hypotension 6. Continue Questran check lipid profile, will do other labs including a CBC anticoagulated a CMP and a TSH.   Next appointment: 6 months   Medication Adjustments/Labs and Tests Ordered: Current medicines are reviewed at length with the patient today.  Concerns regarding medicines are outlined above.  No orders of the defined types were placed in this encounter.  No orders of the defined types were placed in this encounter.   Chief Complaint  Patient presents with  . Follow-up  . Coronary Artery Disease  . Anticoagulation    History of Present Illness:    Greg Oconnor is a 87 y.o. male with a hx of CAD CABG 2005 hypertension orthostatic  hypotension hyperlipidemia heart block with permanent pacemaker chronic anticoagulation and statin intolerance last seen 10/27/2019.  Compliance with diet, lifestyle and medications: Yes  He continues to do well He is getting ready to start his garden he is going to wait a week or 2 In general his blood pressure at home has been well controlled he runs 1 40-1 50 systolic rarely he is 80 in the morning and he feels lightheaded and once in a while he is greater than 160 but has not needed to take an extra tablet of his ACE inhibitor.  This is goal for him because in the past he has had severe symptomatic orthostatic hypotension especially when working outdoors. He tolerates his anticoagulant without any bleeding complication He cares for himself does garden work and has had no chest pain edema shortness of breath palpitation or syncope.  12/08/2019: CMP normal except Bili 1.5 BUN 28 His pacemaker remote check yesterday showed normal device parameters function battery life 124 months and 7 brief mode switching the longest 14 seconds. Past Medical History:  Diagnosis Date  . Anemia, chronic disease 05/12/2016  . Anticoagulant long-term use 07/19/2015  . Anticoagulated 06/17/2014  . Atrioventricular block, complete (HCC) 12/31/2014  . 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 BPH (benign prostatic hyperplasia) 07/20/2015  . CAD, multiple vessel 07/20/2015  . Cardiomyopathy (HCC) 07/20/2015  . Chronic diarrhea 11/20/2018  . Coronary artery disease involving native coronary artery of native heart with angina pectoris (HCC) 07/20/2015  .  Diabetes mellitus with stage 3 chronic kidney disease (HCC) 07/20/2015  . Essential hypertension 07/20/2015  . GERD without esophagitis 07/20/2015  . Hepatitis A   . History of pulmonary embolism 07/20/2015  . Hypertensive heart disease without heart failure 06/17/2014  . Mixed hyperlipidemia 07/20/2015  . Old myocardial infarction 07/20/2015   . 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 Orthostatic hypotension 07/20/2015  . Pacemaker 06/17/2014   OLD ATRIAL LEAD failure  . Pacemaker reprogramming/check 06/17/2014   Formatting of this note might be different from the original. OLD ATRIAL LEAD failure  . Paroxysmal atrial fibrillation (HCC) 05/31/2015  . Pyuria 06/06/2019  . Stage 3 chronic kidney disease (HCC) 07/20/2015  . Statin intolerance 10/13/2018  . Subconjunctival hemorrhage 07/20/2015  . Type 2 diabetes mellitus without complication, without long-term current use of insulin (HCC) 07/20/2015  . Vitamin B12 deficiency 07/20/2015    Past Surgical History:  Procedure Laterality Date  . APPENDECTOMY    . CHOLECYSTECTOMY    . CORONARY ARTERY BYPASS GRAFT    . CYSTOSCOPY    . INSERT / REPLACE / REMOVE PACEMAKER     [employer]  . PPM GENERATOR CHANGEOUT N/A 10/25/2018   Procedure: PPM GENERATOR CHANGEOUT;  Surgeon: Regan Lemming, MD;  Location: [facility];  Service: Cardiovascular;  Laterality: N/A;  . PROSTATECTOMY      Current Medications: Current Meds  Medication Sig  . acetaminophen (TYLENOL) 500 MG tablet Take 500 mg by mouth every 6 (six) hours as needed for moderate pain or headache.  . cetirizine (ZYRTEC) 10 MG tablet Take 10 mg by mouth daily as needed for allergies.   . cholestyramine (QUESTRAN) 4 GM/DOSE powder Take 4 g by mouth daily as needed (loose stools).   . folic acid (FOLVITE) 800 MCG tablet Take 800 mcg by mouth daily.  Marland Kitchen lisinopril (ZESTRIL) 20 MG tablet Take 10-20 mg by mouth daily as needed (if systolic bp is 135 or greater).   . nitroGLYCERIN (NITROSTAT) 0.4 MG SL tablet Place 1 tablet (0.4 mg total) under the tongue every 5 (five) minutes as needed for chest pain.  . Simethicone 125 MG CAPS Take 125 mg by mouth daily as needed (gas).  . sodium chloride (OCEAN) 0.65 % SOLN nasal spray Place 1 spray into both nostrils as needed for congestion.  . trolamine salicylate (ASPERCREME) 10 % cream Apply 1  application topically as needed for muscle pain.  . vitamin B-12 (CYANOCOBALAMIN) 500 MCG tablet Take 500 mcg by mouth daily.  Marland Kitchen warfarin (COUMADIN) 5 MG tablet TAKE 1/2 TO 1 (ONE-HALF TO ONE) TABLET BY MOUTH DAILY AS DIRECTED BY COUMADIN CLINIC     Allergies:   Ezetimibe; Fenofibrate micronized; Fish oil; Niacin; Simvastatin; and Tetanus toxoid, adsorbed   Social History   Socioeconomic History  . Marital status: Married    Spouse name: Not on file  . Number of children: Not on file  . Years of education: Not on file  . Highest education level: Not on file  Occupational History  . Not on file  Tobacco Use  . Smoking status: Former Smoker    Types: Cigarettes  . Smokeless tobacco: Never Used  Vaping Use  . Vaping Use: Never used  Substance and Sexual Activity  . Alcohol use: Not Currently  . Drug use: Never  . Sexual activity: Not on file  Other Topics Concern  . Not on file  Social History Narrative  . Not on file   Social Determinants of Health  Financial Resource Strain: Not on file  Food Insecurity: Not on file  Transportation Needs: Not on file  Physical Activity: Not on file  Stress: Not on file  Social Connections: Not on file     Family History: The patient's family history includes Cancer in his father; Heart Problems in his brother and mother; Parkinson's disease in his brother; Stroke in his mother. ROS:   Please see the history of present illness.    All other systems reviewed and are negative.  EKGs/Labs/Other Studies Reviewed:    The following studies were reviewed today:   Recent Labs: 10/27/2019: ALT 14; BUN 20; Creatinine, Ser 0.95; Potassium 4.4; Sodium 142  Recent Lipid Panel    Component Value Date/Time   CHOL 144 10/27/2019 1033   TRIG 83 10/27/2019 1033   HDL 45 10/27/2019 1033   CHOLHDL 3.2 10/27/2019 1033   LDLCALC 83 10/27/2019 1033    Physical Exam:    VS:  BP (!) 168/80 (BP Location: Left Arm, Patient Position: Sitting)    Pulse 100   Ht 5' 7.5" (1.715 m)   Wt 136 lb 12.8 oz (62.1 kg)   SpO2 99%   BMI 21.11 kg/m     Wt Readings from Last 3 Encounters:  04/28/20 136 lb 12.8 oz (62.1 kg)  03/29/20 137 lb 3.2 oz (62.2 kg)  10/27/19 139 lb 3.2 oz (63.1 kg)     GEN: He appears his age well nourished, well developed in no acute distress HEENT: Normal NECK: No JVD; No carotid bruits LYMPHATICS: No lymphadenopathy CARDIAC: RRR, no murmurs, rubs, gallops RESPIRATORY:  Clear to auscultation without rales, wheezing or rhonchi  ABDOMEN: Soft, non-tender, non-distended MUSCULOSKELETAL:  No edema; No deformity  SKIN: Warm and dry NEUROLOGIC:  Alert and oriented x 3 PSYCHIATRIC:  Normal affect    Signed, [name], MD  04/28/2020 10:23 AM    [facility]

## 2020-04-29 LAB — COMPREHENSIVE METABOLIC PANEL
ALT: 16 IU/L (ref 0–44)
AST: 21 IU/L (ref 0–40)
Albumin/Globulin Ratio: 1.9 (ref 1.2–2.2)
Albumin: 4.6 g/dL (ref 3.5–4.6)
Alkaline Phosphatase: 67 IU/L (ref 44–121)
BUN/Creatinine Ratio: 27 — ABNORMAL HIGH (ref 10–24)
BUN: 29 mg/dL (ref 10–36)
Bilirubin Total: 1.1 mg/dL (ref 0.0–1.2)
CO2: 22 mmol/L (ref 20–29)
Calcium: 9.4 mg/dL (ref 8.6–10.2)
Chloride: 105 mmol/L (ref 96–106)
Creatinine, Ser: 1.06 mg/dL (ref 0.76–1.27)
Globulin, Total: 2.4 g/dL (ref 1.5–4.5)
Glucose: 105 mg/dL — ABNORMAL HIGH (ref 65–99)
Potassium: 4.6 mmol/L (ref 3.5–5.2)
Sodium: 142 mmol/L (ref 134–144)
Total Protein: 7 g/dL (ref 6.0–8.5)
eGFR: 66 mL/min/{1.73_m2} (ref >59)

## 2020-04-29 LAB — CBC
Hematocrit: 39.7 % (ref 37.5–51.0)
Hemoglobin: 13.4 g/dL (ref 13.0–17.7)
MCH: 31.4 pg (ref 26.6–33.0)
MCHC: 33.8 g/dL (ref 31.5–35.7)
MCV: 93 fL (ref 79–97)
Platelets: 191 10*3/uL (ref 150–450)
RBC: 4.27 x10E6/uL (ref 4.14–5.80)
RDW: 13.2 % (ref 11.6–15.4)
WBC: 4.4 10*3/uL (ref 3.4–10.8)

## 2020-04-29 LAB — LIPID PANEL
Chol/HDL Ratio: 3.2 ratio (ref 0.0–5.0)
Cholesterol, Total: 171 mg/dL (ref 100–199)
HDL: 53 mg/dL (ref >39)
LDL Chol Calc (NIH): 99 mg/dL (ref 0–99)
Triglycerides: 103 mg/dL (ref 0–149)
VLDL Cholesterol Cal: 19 mg/dL (ref 5–40)

## 2020-04-29 LAB — TSH: TSH: 4.11 u[IU]/mL (ref 0.450–4.500)

## 2020-05-10 NOTE — Progress Notes (Signed)
Remote pacemaker transmission.   

## 2020-06-08 ENCOUNTER — Ambulatory Visit (INDEPENDENT_AMBULATORY_CARE_PROVIDER_SITE_OTHER): Payer: Medicare HMO

## 2020-06-08 ENCOUNTER — Other Ambulatory Visit: Payer: Self-pay

## 2020-06-08 DIAGNOSIS — I480 Paroxysmal atrial fibrillation: Secondary | ICD-10-CM

## 2020-06-08 DIAGNOSIS — M791 Myalgia, unspecified site: Secondary | ICD-10-CM

## 2020-06-08 DIAGNOSIS — Z5181 Encounter for therapeutic drug level monitoring: Secondary | ICD-10-CM

## 2020-06-08 DIAGNOSIS — T466X5A Adverse effect of antihyperlipidemic and antiarteriosclerotic drugs, initial encounter: Secondary | ICD-10-CM

## 2020-06-08 HISTORY — DX: Adverse effect of antihyperlipidemic and antiarteriosclerotic drugs, initial encounter: T46.6X5A

## 2020-06-08 HISTORY — DX: Myalgia, unspecified site: M79.10

## 2020-06-08 LAB — POCT INR: INR: 2.1 (ref 2.0–3.0)

## 2020-06-08 NOTE — Patient Instructions (Signed)
Take 2 tablets tonight only and then Continue taking 1 tablet daily except 1/2 tablet Monday and Friday. Repeat INR in 6 weeks.

## 2020-07-20 ENCOUNTER — Other Ambulatory Visit: Payer: Self-pay

## 2020-07-20 ENCOUNTER — Ambulatory Visit (INDEPENDENT_AMBULATORY_CARE_PROVIDER_SITE_OTHER): Payer: Medicare HMO

## 2020-07-20 DIAGNOSIS — Z5181 Encounter for therapeutic drug level monitoring: Secondary | ICD-10-CM | POA: Diagnosis not present

## 2020-07-20 DIAGNOSIS — I480 Paroxysmal atrial fibrillation: Secondary | ICD-10-CM

## 2020-07-20 LAB — POCT INR: INR: 3.3 — AB (ref 2.0–3.0)

## 2020-07-20 NOTE — Patient Instructions (Signed)
Continue taking 1 tablet daily except 1/2 tablet Monday and Friday. Repeat INR in 6 weeks. Eat greens tonight.

## 2020-07-23 ENCOUNTER — Ambulatory Visit (INDEPENDENT_AMBULATORY_CARE_PROVIDER_SITE_OTHER): Payer: Medicare HMO

## 2020-07-23 DIAGNOSIS — I442 Atrioventricular block, complete: Secondary | ICD-10-CM | POA: Diagnosis not present

## 2020-07-23 LAB — CUP PACEART REMOTE DEVICE CHECK
Battery Remaining Longevity: 104 mo
Battery Remaining Percentage: 86 %
Battery Voltage: 3.01 V
Brady Statistic AS VP Percent: 95 %
Brady Statistic AS VS Percent: 1 %
Brady Statistic RV Percent Paced: 98 %
Date Time Interrogation Session: 20220715025453
Implantable Lead Implant Date: 19980122
Implantable Lead Implant Date: 20040315
Implantable Lead Location: 753859
Implantable Lead Location: 753860
Implantable Pulse Generator Implant Date: 20201016
Lead Channel Impedance Value: 410 Ohm
Lead Channel Impedance Value: 650 Ohm
Lead Channel Pacing Threshold Amplitude: 1 V
Lead Channel Pacing Threshold Pulse Width: 0.5 ms
Lead Channel Sensing Intrinsic Amplitude: 12 mV
Lead Channel Sensing Intrinsic Amplitude: 3 mV
Lead Channel Setting Pacing Amplitude: 2.5 V
Lead Channel Setting Pacing Pulse Width: 0.5 ms
Lead Channel Setting Sensing Sensitivity: 4 mV
Pulse Gen Model: 2272
Pulse Gen Serial Number: 9168465

## 2020-08-16 NOTE — Progress Notes (Signed)
Remote pacemaker transmission.   

## 2020-08-31 ENCOUNTER — Other Ambulatory Visit: Payer: Self-pay

## 2020-08-31 ENCOUNTER — Ambulatory Visit (INDEPENDENT_AMBULATORY_CARE_PROVIDER_SITE_OTHER): Payer: Medicare HMO

## 2020-08-31 DIAGNOSIS — Z5181 Encounter for therapeutic drug level monitoring: Secondary | ICD-10-CM | POA: Diagnosis not present

## 2020-08-31 DIAGNOSIS — I480 Paroxysmal atrial fibrillation: Secondary | ICD-10-CM

## 2020-08-31 LAB — POCT INR: INR: 2.7 (ref 2.0–3.0)

## 2020-08-31 NOTE — Patient Instructions (Signed)
Description   Continue taking 1 tablet daily except 1/2 tablet Monday and Friday. Repeat INR in 6 weeks.          

## 2020-10-12 ENCOUNTER — Ambulatory Visit (INDEPENDENT_AMBULATORY_CARE_PROVIDER_SITE_OTHER): Payer: Medicare HMO

## 2020-10-12 ENCOUNTER — Other Ambulatory Visit: Payer: Self-pay

## 2020-10-12 DIAGNOSIS — Z5181 Encounter for therapeutic drug level monitoring: Secondary | ICD-10-CM | POA: Diagnosis not present

## 2020-10-12 DIAGNOSIS — I480 Paroxysmal atrial fibrillation: Secondary | ICD-10-CM | POA: Diagnosis not present

## 2020-10-12 LAB — POCT INR: INR: 2.8 (ref 2.0–3.0)

## 2020-10-12 NOTE — Patient Instructions (Signed)
Description   Continue taking 1 tablet daily except 1/2 tablet Monday and Friday. Repeat INR in 6 weeks.

## 2020-10-22 ENCOUNTER — Ambulatory Visit (INDEPENDENT_AMBULATORY_CARE_PROVIDER_SITE_OTHER): Payer: Medicare HMO

## 2020-10-22 DIAGNOSIS — I480 Paroxysmal atrial fibrillation: Secondary | ICD-10-CM

## 2020-10-26 LAB — CUP PACEART REMOTE DEVICE CHECK
Battery Remaining Longevity: 100 mo
Battery Remaining Percentage: 83 %
Battery Voltage: 3.01 V
Brady Statistic AS VP Percent: 95 %
Brady Statistic AS VS Percent: 1 %
Brady Statistic RV Percent Paced: 98 %
Date Time Interrogation Session: 20221014020013
Implantable Lead Implant Date: 19980122
Implantable Lead Implant Date: 20040315
Implantable Lead Location: 753859
Implantable Lead Location: 753860
Implantable Pulse Generator Implant Date: 20201016
Lead Channel Impedance Value: 400 Ohm
Lead Channel Impedance Value: 610 Ohm
Lead Channel Pacing Threshold Amplitude: 1 V
Lead Channel Pacing Threshold Pulse Width: 0.5 ms
Lead Channel Sensing Intrinsic Amplitude: 1.5 mV
Lead Channel Sensing Intrinsic Amplitude: 12 mV
Lead Channel Setting Pacing Amplitude: 2.5 V
Lead Channel Setting Pacing Pulse Width: 0.5 ms
Lead Channel Setting Sensing Sensitivity: 4 mV
Pulse Gen Model: 2272
Pulse Gen Serial Number: 9168465

## 2020-10-29 ENCOUNTER — Encounter: Payer: Self-pay | Admitting: Cardiology

## 2020-10-29 ENCOUNTER — Ambulatory Visit (INDEPENDENT_AMBULATORY_CARE_PROVIDER_SITE_OTHER): Payer: Medicare HMO | Admitting: Cardiology

## 2020-10-29 ENCOUNTER — Other Ambulatory Visit: Payer: Self-pay

## 2020-10-29 VITALS — BP 136/68 | HR 88 | Ht 67.5 in | Wt 134.6 lb

## 2020-10-29 DIAGNOSIS — I480 Paroxysmal atrial fibrillation: Secondary | ICD-10-CM

## 2020-10-29 DIAGNOSIS — Z7901 Long term (current) use of anticoagulants: Secondary | ICD-10-CM

## 2020-10-29 DIAGNOSIS — I25119 Atherosclerotic heart disease of native coronary artery with unspecified angina pectoris: Secondary | ICD-10-CM

## 2020-10-29 DIAGNOSIS — I119 Hypertensive heart disease without heart failure: Secondary | ICD-10-CM

## 2020-10-29 DIAGNOSIS — Z950 Presence of cardiac pacemaker: Secondary | ICD-10-CM | POA: Diagnosis not present

## 2020-10-29 DIAGNOSIS — E782 Mixed hyperlipidemia: Secondary | ICD-10-CM

## 2020-10-29 DIAGNOSIS — I442 Atrioventricular block, complete: Secondary | ICD-10-CM

## 2020-10-29 DIAGNOSIS — Z789 Other specified health status: Secondary | ICD-10-CM

## 2020-10-29 NOTE — Patient Instructions (Signed)

## 2020-10-29 NOTE — Progress Notes (Signed)
Cardiology Office Note:    Date:  10/29/2020   ID:  Greg Oconnor, DOB Mar 21, 1925, MRN [id number]  PCP:  [name], MD  Cardiologist:  [name], MD    Referring MD: [name], MD    ASSESSMENT:    1. Paroxysmal atrial fibrillation (HCC)   2. Anticoagulant long-term use   3. Atrioventricular block, complete (HCC)   4. Pacemaker   5. Coronary artery disease involving native coronary artery of native heart with angina pectoris (HCC)   6. Hypertensive heart disease without heart failure   7. Mixed hyperlipidemia   8. Statin intolerance    PLAN:    In order of problems listed above:  Stable he continues to do well having no recurrent atrial fibrillation clinically and is on warfarin for stroke prophylaxis.  He has had no bleeding complication Stable device function review followed in clinic Stable CAD he has no anginal discomfort. Blood pressure at target continue lisinopril he checks his home blood pressure and titrates his dosage because of previous orthostatic hypotension admission Statin intolerant continue cholestyramine   Next appointment: 6 months   Medication Adjustments/Labs and Tests Ordered: Current medicines are reviewed at length with the patient today.  Concerns regarding medicines are outlined above.  No orders of the defined types were placed in this encounter.  No orders of the defined types were placed in this encounter.   Chief Complaint  Patient presents with   Follow-up   Coronary Artery Disease     History of Present Illness:    Greg Oconnor is a 87 y.o. male with a hx of CAD with remote CABG 2005 hypertensive heart disease orthostatic hypotension hyperlipidemia and statin intolerance and heart block with permanent pacemaker followed in our device clinic last seen 04/28/2020. Greg Oconnor continues to do well Compliance with diet, lifestyle and medications: Yes He has had no cardiovascular symptoms of edema shortness of  breath chest pain palpitation or syncope.  He checks on blood pressure and titrates his medications he has had no recurrent orthostatic hypotension  Recent labs [facility] PCP 06/08/2000, Sodium 142 potassium 4.6 creatinine 1.20 GFR 57 cc chronic mild bilirubin elevation 1.5 Hemoglobin 12.9 platelets 166,000 Lipid profile 06/04/2019 cholesterol 149 triglyceride 152 HDL 42 non-HDL cholesterol 107 LDL direct 98 Recent device check 10/22/2020 remote: Normal device and lead function he has brief episodes of mode switching for atrial tachycardia 2 to 16 seconds less than 1% burden.  He is ventricularly paced 98% of the time. 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 07/19/2015   BPH (benign prostatic hyperplasia) 07/20/2015   CAD, multiple vessel 07/20/2015   Cardiomyopathy (HCC) 07/20/2015   Chronic diarrhea 11/20/2018   Coronary artery disease involving native coronary artery of native heart with angina pectoris (HCC) 07/20/2015   Diabetes mellitus with stage 3 chronic kidney disease (HCC) 07/20/2015   Essential hypertension 07/20/2015   GERD without esophagitis 07/20/2015   Hepatitis A    History of pulmonary embolism 07/20/2015   Hypertensive heart disease without heart failure 06/17/2014   Mixed hyperlipidemia 07/20/2015   Old myocardial infarction 07/20/2015   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 OLD ATRIAL LEAD failure   Paroxysmal atrial fibrillation (HCC) 05/31/2015   Pyuria 06/06/2019   Stage 3 chronic kidney disease (HCC) 07/20/2015   Statin intolerance 10/13/2018   Subconjunctival hemorrhage  07/20/2015   Type 2 diabetes mellitus without complication, without long-term current use of insulin (HCC) 07/20/2015   Vitamin B12 deficiency 07/20/2015    Past Surgical History:  Procedure Laterality Date   APPENDECTOMY     CHOLECYSTECTOMY     CORONARY ARTERY BYPASS GRAFT     CYSTOSCOPY     INSERT / REPLACE / REMOVE PACEMAKER     [employer] Jude   PPM GENERATOR CHANGEOUT N/A 10/25/2018   Procedure: PPM GENERATOR CHANGEOUT;  Surgeon: Regan Lemming, MD;  Location: [facility];  Service: Cardiovascular;  Laterality: N/A;   PROSTATECTOMY      Current Medications: Current Meds  Medication Sig   acetaminophen (TYLENOL) 500 MG tablet Take 500 mg by mouth every 6 (six) hours as needed for moderate pain or headache.   cetirizine (ZYRTEC) 10 MG tablet Take 10 mg by mouth daily as needed for allergies.    cholestyramine (QUESTRAN) 4 GM/DOSE powder Take 4 g by mouth daily as needed (loose stools).    folic acid (FOLVITE) 800 MCG tablet Take 800 mcg by mouth daily.   lisinopril (ZESTRIL) 20 MG tablet Take 0.5-1 tablets (10-20 mg total) by mouth daily as needed (if systolic bp is 135 or greater).   nitroGLYCERIN (NITROSTAT) 0.4 MG SL tablet Place 1 tablet (0.4 mg total) under the tongue every 5 (five) minutes as needed for chest pain.   Simethicone 125 MG CAPS Take 125 mg by mouth daily as needed (gas).   sodium chloride (OCEAN) 0.65 % SOLN nasal spray Place 1 spray into both nostrils as needed for congestion.   trolamine salicylate (ASPERCREME) 10 % cream Apply 1 application topically as needed for muscle pain.   vitamin B-12 (CYANOCOBALAMIN) 500 MCG tablet Take 500 mcg by mouth daily.   warfarin (COUMADIN) 5 MG tablet TAKE 1/2 TO 1 (ONE-HALF TO ONE) TABLET BY MOUTH DAILY AS DIRECTED BY COUMADIN CLINIC     Allergies:   Ezetimibe; Fenofibrate micronized; Fish oil; Niacin; Simvastatin; and Tetanus toxoid, adsorbed   Social History   Socioeconomic History   Marital status: Married     Spouse name: Not on file   Number of children: Not on file   Years of education: Not on file   Highest education level: Not on file  Occupational History   Not on file  Tobacco Use   Smoking status: Former    Types: Cigarettes   Smokeless tobacco: Never  Vaping Use   Vaping Use: Never used  Substance and Sexual Activity   Alcohol use: Not Currently   Drug use: Never   Sexual activity: Not on file  Other Topics Concern   Not on file  Social History Narrative   Not on file   Social Determinants of Health   Financial Resource Strain: Not on file  Food Insecurity: Not on file  Transportation Needs: Not on file  Physical Activity: Not on file  Stress: Not on file  Social Connections: Not on file     Family History: The patient's family history includes Cancer in his father; Heart Problems in his brother and mother; Parkinson's disease in his brother; Stroke in his mother. ROS:   Please see the history of present illness.    All other systems reviewed and  are negative.  EKGs/Labs/Other Studies Reviewed:    The following studies were reviewed today:    Recent Labs: 04/28/2020: ALT 16; BUN 29; Creatinine, Ser 1.06; Hemoglobin 13.4; Platelets 191; Potassium 4.6; Sodium 142; TSH 4.110  Recent Lipid Panel    Component Value Date/Time   CHOL 171 04/28/2020 1043   TRIG 103 04/28/2020 1043   HDL 53 04/28/2020 1043   CHOLHDL 3.2 04/28/2020 1043   LDLCALC 99 04/28/2020 1043    Physical Exam:    VS:  BP 136/68   Pulse 88   Ht 5' 7.5" (1.715 m)   Wt 134 lb 9.6 oz (61.1 kg)   SpO2 98%   BMI 20.77 kg/m     Wt Readings from Last 3 Encounters:  10/29/20 134 lb 9.6 oz (61.1 kg)  04/28/20 136 lb 12.8 oz (62.1 kg)  03/29/20 137 lb 3.2 oz (62.2 kg)     GEN:  Well nourished, well developed in no acute distress he looks younger than his age HEENT: Normal NECK: No JVD; No carotid bruits LYMPHATICS: No lymphadenopathy CARDIAC: RRR, no murmurs, rubs, gallops RESPIRATORY:   Clear to auscultation without rales, wheezing or rhonchi  ABDOMEN: Soft, non-tender, non-distended MUSCULOSKELETAL:  No edema; No deformity  SKIN: Warm and dry NEUROLOGIC:  Alert and oriented x 3 PSYCHIATRIC:  Normal affect    Signed, [name], MD  10/29/2020 2:06 PM    [facility]

## 2020-10-29 NOTE — Progress Notes (Signed)
Remote pacemaker transmission.   

## 2020-11-23 ENCOUNTER — Other Ambulatory Visit: Payer: Self-pay

## 2020-11-23 ENCOUNTER — Ambulatory Visit (INDEPENDENT_AMBULATORY_CARE_PROVIDER_SITE_OTHER): Payer: Medicare HMO

## 2020-11-23 DIAGNOSIS — Z5181 Encounter for therapeutic drug level monitoring: Secondary | ICD-10-CM | POA: Diagnosis not present

## 2020-11-23 DIAGNOSIS — I480 Paroxysmal atrial fibrillation: Secondary | ICD-10-CM

## 2020-11-23 LAB — POCT INR: INR: 2.2 (ref 2.0–3.0)

## 2020-11-23 NOTE — Patient Instructions (Signed)
Description   Take 1.5 tablets today and then continue taking 1 tablet daily except 1/2 tablet Monday and Friday. Repeat INR in 6 weeks.

## 2020-12-08 ENCOUNTER — Telehealth: Payer: Self-pay | Admitting: Cardiology

## 2020-12-08 DIAGNOSIS — I442 Atrioventricular block, complete: Secondary | ICD-10-CM

## 2020-12-08 DIAGNOSIS — E782 Mixed hyperlipidemia: Secondary | ICD-10-CM

## 2020-12-08 DIAGNOSIS — Z950 Presence of cardiac pacemaker: Secondary | ICD-10-CM

## 2020-12-08 DIAGNOSIS — I25119 Atherosclerotic heart disease of native coronary artery with unspecified angina pectoris: Secondary | ICD-10-CM

## 2020-12-08 DIAGNOSIS — I951 Orthostatic hypotension: Secondary | ICD-10-CM

## 2020-12-08 DIAGNOSIS — Z7901 Long term (current) use of anticoagulants: Secondary | ICD-10-CM

## 2020-12-08 DIAGNOSIS — I10 Essential (primary) hypertension: Secondary | ICD-10-CM

## 2020-12-08 MED ORDER — WARFARIN SODIUM 5 MG PO TABS: ORAL_TABLET | ORAL | 1 refills | Status: DC

## 2020-12-08 NOTE — Telephone Encounter (Signed)
*  STAT* If patient is at the pharmacy, call can be transferred to refill team.   1. Which medications need to be refilled? (please list name of each medication and dose if known) Warfarin  2. Which pharmacy/location (including street and city if local pharmacy) is medication to be sent to? [facility] [place]  3. Do they need a 30 day or 90 day supply? 90 days and refillss

## 2020-12-08 NOTE — Telephone Encounter (Signed)
Prescription refill request received for warfarin Lov: 10/29/20 [facility])  Next INR check: 01/04/21 Warfarin tablet strength: 5mg   Appropriate dose and refill sent to requested pharmacy.

## 2021-01-04 ENCOUNTER — Ambulatory Visit (INDEPENDENT_AMBULATORY_CARE_PROVIDER_SITE_OTHER): Payer: Medicare HMO

## 2021-01-04 ENCOUNTER — Other Ambulatory Visit: Payer: Self-pay

## 2021-01-04 DIAGNOSIS — Z5181 Encounter for therapeutic drug level monitoring: Secondary | ICD-10-CM

## 2021-01-04 DIAGNOSIS — I480 Paroxysmal atrial fibrillation: Secondary | ICD-10-CM | POA: Diagnosis not present

## 2021-01-04 LAB — POCT INR: INR: 2 (ref 2.0–3.0)

## 2021-01-04 NOTE — Patient Instructions (Signed)
Description   Take 1.5 tablets today and then START taking 1 tablet daily except 1/2 tablet Mondays. Repeat INR in 3 weeks.

## 2021-01-21 ENCOUNTER — Ambulatory Visit (INDEPENDENT_AMBULATORY_CARE_PROVIDER_SITE_OTHER): Payer: Medicare HMO

## 2021-01-21 DIAGNOSIS — I480 Paroxysmal atrial fibrillation: Secondary | ICD-10-CM

## 2021-01-21 LAB — CUP PACEART REMOTE DEVICE CHECK
Battery Remaining Longevity: 97 mo
Battery Remaining Percentage: 81 %
Battery Voltage: 3.01 V
Brady Statistic AS VP Percent: 95 %
Brady Statistic AS VS Percent: 1 %
Brady Statistic RV Percent Paced: 98 %
Date Time Interrogation Session: 20230113020015
Implantable Lead Implant Date: 19980122
Implantable Lead Implant Date: 20040315
Implantable Lead Location: 753859
Implantable Lead Location: 753860
Implantable Pulse Generator Implant Date: 20201016
Lead Channel Impedance Value: 400 Ohm
Lead Channel Impedance Value: 610 Ohm
Lead Channel Pacing Threshold Amplitude: 1 V
Lead Channel Pacing Threshold Pulse Width: 0.5 ms
Lead Channel Sensing Intrinsic Amplitude: 12 mV
Lead Channel Sensing Intrinsic Amplitude: 3.4 mV
Lead Channel Setting Pacing Amplitude: 2.5 V
Lead Channel Setting Pacing Pulse Width: 0.5 ms
Lead Channel Setting Sensing Sensitivity: 4 mV
Pulse Gen Model: 2272
Pulse Gen Serial Number: 9168465

## 2021-01-25 ENCOUNTER — Ambulatory Visit (INDEPENDENT_AMBULATORY_CARE_PROVIDER_SITE_OTHER): Payer: Medicare HMO

## 2021-01-25 ENCOUNTER — Other Ambulatory Visit: Payer: Self-pay

## 2021-01-25 DIAGNOSIS — I480 Paroxysmal atrial fibrillation: Secondary | ICD-10-CM

## 2021-01-25 DIAGNOSIS — Z5181 Encounter for therapeutic drug level monitoring: Secondary | ICD-10-CM

## 2021-01-25 LAB — POCT INR: INR: 2.5 (ref 2.0–3.0)

## 2021-01-25 NOTE — Patient Instructions (Signed)
Description   Continue taking 1 tablet daily except 1/2 tablet Mondays. Repeat INR in 4 weeks. [facility]madin Clinic [phone]

## 2021-02-01 NOTE — Progress Notes (Signed)
Remote pacemaker transmission.   

## 2021-02-22 ENCOUNTER — Other Ambulatory Visit: Payer: Self-pay

## 2021-02-22 ENCOUNTER — Ambulatory Visit (INDEPENDENT_AMBULATORY_CARE_PROVIDER_SITE_OTHER): Payer: Medicare HMO

## 2021-02-22 DIAGNOSIS — I480 Paroxysmal atrial fibrillation: Secondary | ICD-10-CM | POA: Diagnosis not present

## 2021-02-22 DIAGNOSIS — Z5181 Encounter for therapeutic drug level monitoring: Secondary | ICD-10-CM | POA: Diagnosis not present

## 2021-02-22 LAB — POCT INR: INR: 1.4 — AB (ref 2.0–3.0)

## 2021-02-22 NOTE — Patient Instructions (Signed)
Description   Take 2 tablets today and 1.5 tablets tomorrow and then continue taking 1 tablet daily except 1/2 tablet Mondays. Repeat INR in 2 weeks. Coumadin Clinic [phone]

## 2021-03-08 ENCOUNTER — Ambulatory Visit (INDEPENDENT_AMBULATORY_CARE_PROVIDER_SITE_OTHER): Payer: Medicare HMO

## 2021-03-08 ENCOUNTER — Other Ambulatory Visit: Payer: Self-pay

## 2021-03-08 DIAGNOSIS — I480 Paroxysmal atrial fibrillation: Secondary | ICD-10-CM | POA: Diagnosis not present

## 2021-03-08 DIAGNOSIS — Z5181 Encounter for therapeutic drug level monitoring: Secondary | ICD-10-CM | POA: Diagnosis not present

## 2021-03-08 LAB — POCT INR: INR: 3.9 — AB (ref 2.0–3.0)

## 2021-03-08 NOTE — Patient Instructions (Signed)
Description   Hold today's dose and then continue taking 1 tablet daily except 1/2 tablet Mondays. Repeat INR in 2 weeks. Coumadin Clinic [phone]

## 2021-03-22 ENCOUNTER — Other Ambulatory Visit: Payer: Self-pay

## 2021-03-22 ENCOUNTER — Ambulatory Visit (INDEPENDENT_AMBULATORY_CARE_PROVIDER_SITE_OTHER): Payer: Medicare HMO

## 2021-03-22 DIAGNOSIS — I480 Paroxysmal atrial fibrillation: Secondary | ICD-10-CM | POA: Diagnosis not present

## 2021-03-22 DIAGNOSIS — Z5181 Encounter for therapeutic drug level monitoring: Secondary | ICD-10-CM | POA: Diagnosis not present

## 2021-03-22 LAB — POCT INR: INR: 1.2 — AB (ref 2.0–3.0)

## 2021-03-22 NOTE — Patient Instructions (Signed)
Description   ?Take 1.5 tablets today and 1.5 tablets tomorrow and then continue taking 1 tablet daily except 1/2 tablet Mondays. ?Repeat INR in 1 week. ?C[facility]madin Clinic [phone] ?  ?   ?

## 2021-03-29 ENCOUNTER — Ambulatory Visit (INDEPENDENT_AMBULATORY_CARE_PROVIDER_SITE_OTHER): Payer: Medicare HMO

## 2021-03-29 ENCOUNTER — Other Ambulatory Visit: Payer: Self-pay

## 2021-03-29 DIAGNOSIS — I480 Paroxysmal atrial fibrillation: Secondary | ICD-10-CM

## 2021-03-29 LAB — POCT INR: INR: 1.9 — AB (ref 2.0–3.0)

## 2021-03-29 NOTE — Patient Instructions (Signed)
Description   ?Take 1.5 tablets today and 1.5 tablets tomorrow and then continue taking 1 tablet daily except 1/2 tablet Mondays. ?Repeat INR in 2 weeks. ?Coumadin Clinic [phone] ?  ?   ?

## 2021-04-12 ENCOUNTER — Ambulatory Visit (INDEPENDENT_AMBULATORY_CARE_PROVIDER_SITE_OTHER): Payer: Medicare HMO

## 2021-04-12 ENCOUNTER — Other Ambulatory Visit: Payer: Self-pay

## 2021-04-12 DIAGNOSIS — Z950 Presence of cardiac pacemaker: Secondary | ICD-10-CM

## 2021-04-12 DIAGNOSIS — I480 Paroxysmal atrial fibrillation: Secondary | ICD-10-CM

## 2021-04-12 DIAGNOSIS — Z5181 Encounter for therapeutic drug level monitoring: Secondary | ICD-10-CM

## 2021-04-12 DIAGNOSIS — I951 Orthostatic hypotension: Secondary | ICD-10-CM

## 2021-04-12 DIAGNOSIS — I442 Atrioventricular block, complete: Secondary | ICD-10-CM

## 2021-04-12 DIAGNOSIS — I10 Essential (primary) hypertension: Secondary | ICD-10-CM

## 2021-04-12 DIAGNOSIS — E782 Mixed hyperlipidemia: Secondary | ICD-10-CM

## 2021-04-12 DIAGNOSIS — Z7901 Long term (current) use of anticoagulants: Secondary | ICD-10-CM

## 2021-04-12 DIAGNOSIS — I25119 Atherosclerotic heart disease of native coronary artery with unspecified angina pectoris: Secondary | ICD-10-CM

## 2021-04-12 LAB — POCT INR: INR: 2.4 (ref 2.0–3.0)

## 2021-04-12 MED ORDER — WARFARIN SODIUM 5 MG PO TABS: ORAL_TABLET | ORAL | 1 refills | Status: DC

## 2021-04-12 NOTE — Patient Instructions (Signed)
Description   ?Take 1.5 tablets today and then continue taking 1 tablet daily except 1/2 tablet Mondays. ?Repeat INR in 3 weeks. ?Coumadin Clinic [phone] ?  ?   ?

## 2021-04-12 NOTE — Telephone Encounter (Signed)
Prescription refill request received for warfarin ?Lov: 10/29/20 [facility])  ?Next INR check: 05/03/21 ?Warfarin tablet strength: 5mg  ? ?Appropriate dose and refill sent to requested pharmacy.  ?

## 2021-04-22 ENCOUNTER — Ambulatory Visit (INDEPENDENT_AMBULATORY_CARE_PROVIDER_SITE_OTHER): Payer: Medicare HMO

## 2021-04-22 DIAGNOSIS — I480 Paroxysmal atrial fibrillation: Secondary | ICD-10-CM

## 2021-04-22 LAB — CUP PACEART REMOTE DEVICE CHECK
Battery Remaining Longevity: 94 mo
Battery Remaining Percentage: 78 %
Battery Voltage: 3.01 V
Brady Statistic AS VP Percent: 96 %
Brady Statistic AS VS Percent: 1 %
Brady Statistic RV Percent Paced: 99 %
Date Time Interrogation Session: 20230414020013
Implantable Lead Implant Date: 19980122
Implantable Lead Implant Date: 20040315
Implantable Lead Location: 753859
Implantable Lead Location: 753860
Implantable Pulse Generator Implant Date: 20201016
Lead Channel Impedance Value: 390 Ohm
Lead Channel Impedance Value: 630 Ohm
Lead Channel Pacing Threshold Amplitude: 1 V
Lead Channel Pacing Threshold Pulse Width: 0.5 ms
Lead Channel Sensing Intrinsic Amplitude: 1.9 mV
Lead Channel Sensing Intrinsic Amplitude: 10 mV
Lead Channel Setting Pacing Amplitude: 2.5 V
Lead Channel Setting Pacing Pulse Width: 0.5 ms
Lead Channel Setting Sensing Sensitivity: 4 mV
Pulse Gen Model: 2272
Pulse Gen Serial Number: 9168465

## 2021-05-01 NOTE — Progress Notes (Signed)
?Cardiology Office Note:   ? ?Date:  05/02/2021  ? ?ID:  Greg Oconnor, DOB Jun 04, 1925, MRN [id number] ? ?PCP:  Raina Mina., MD  ?Cardiologist:  [name], MD   ? ?Referring MD: Raina Mina., MD  ? ? ?ASSESSMENT:   ? ?1. Coronary artery disease involving native coronary artery of native heart with angina pectoris ([place])   ?2. Essential hypertension   ?3. Orthostatic hypotension   ?4. Paroxysmal atrial fibrillation (HCC)   ?5. Anticoagulant long-term use   ?6. Pacemaker   ?7. Mixed hyperlipidemia   ?8. Statin intolerance   ? ?PLAN:   ? ?In order of problems listed above: ? ?He continues to do well with CAD asymptomatic after bypass surgery having no anginal discomfort.  Vigorous active normal exercise tolerance continue medical therapy including his anticoagulant. ?Stable BP at target continue ACE inhibitor no recurrent symptomatic hypotension ?Stable continue his anticoagulation at least for ?Continue nonstatin therapy letter ? ?Next appointment: 1 year ? ? ?Medication Adjustments/Labs and Tests Ordered: ?Current medicines are reviewed at length with the patient today.  Concerns regarding medicines are outlined above.  ?No orders of the defined types were placed in this encounter. ? ?No orders of the defined types were placed in this encounter. ? ? ?Chief Complaint  ?Patient presents with  ? Follow-up  ? Coronary Artery Disease  ? ? ?History of Present Illness:   ? ?Greg Oconnor is a 88 y.o. male with a hx of CAD with remote CABG 2005 hypertensive heart disease orthostatic hypotension hyperlipidemia and statin intolerance and heart block with permanent pacemaker followed in our device clinic last seen 10/29/2020. ? ?Compliance with diet, lifestyle and medications: yes ?He remains very active getting ready for his gardening. ?No chest pain edema shortness of breath no recurrent episodes of hypotension or syncope ?Recent labs reviewed with them was seen and have his device checked in clinic  today ? ?Recent labs 12/08/2020: ?Cholesterol 161 triglycerides 101 HDL 50 non-HDL cholesterol 111 LDL 102 CMP normal except for bilirubin 1.9 chronic elevation creatinine 1.21 potassium 4.5 sodium 139 hemoglobin 12.8 platelet count 188,000. ?Past Medical History:  ?Diagnosis Date  ? Anemia, chronic disease 05/12/2016  ? Anticoagulant long-term use 07/19/2015  ? Anticoagulated 06/17/2014  ? Atrioventricular block, complete ([place]) 12/31/2014  ? 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 BPH (benign prostatic hyperplasia) 07/20/2015  ? CAD, multiple vessel 07/20/2015  ? Cardiomyopathy ([place]) 07/20/2015  ? Chronic diarrhea 11/20/2018  ? Coronary artery disease involving native coronary artery of native heart with angina pectoris ([place]) 07/20/2015  ? Diabetes mellitus with stage 3 chronic kidney disease ([place]) 07/20/2015  ? Essential hypertension 07/20/2015  ? GERD without esophagitis 07/20/2015  ? Hepatitis A   ? History of pulmonary embolism 07/20/2015  ? Hypertensive heart disease without heart failure 06/17/2014  ? Mixed hyperlipidemia 07/20/2015  ? Old myocardial infarction 07/20/2015  ? 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 Orthostatic hypotension 07/20/2015  ? Pacemaker 06/17/2014  ? OLD ATRIAL LEAD failure  ? Pacemaker reprogramming/check 06/17/2014  ? Formatting of this note might be different from the original. OLD ATRIAL LEAD failure  ? Paroxysmal atrial fibrillation ([place]) 05/31/2015  ? Pyuria 06/06/2019  ? Stage 3 chronic kidney disease ([place]) 07/20/2015  ? Statin intolerance 10/13/2018  ? Subconjunctival hemorrhage 07/20/2015  ? Type 2 diabetes mellitus without complication, without long-term current use of insulin ([place]) 07/20/2015  ? Vitamin B12 deficiency 07/20/2015  ? ? ?Past Surgical History:  ?  Procedure Laterality Date  ? APPENDECTOMY    ? CHOLECYSTECTOMY    ? CORONARY ARTERY BYPASS GRAFT    ? CYSTOSCOPY    ? INSERT / REPLACE / REMOVE PACEMAKER    ? St. Jude  ? PPM GENERATOR CHANGEOUT N/A 10/25/2018  ?  Procedure: PPM GENERATOR CHANGEOUT;  Surgeon: Constance Haw, MD;  Location: [facility];  Service: Cardiovascular;  Laterality: N/A;  ? PROSTATECTOMY    ? ? ?Current Medications: ?Current Meds  ?Medication Sig  ? acetaminophen (TYLENOL) 500 MG tablet Take 500 mg by mouth every 6 (six) hours as needed for moderate pain or headache.  ? cetirizine (ZYRTEC) 10 MG tablet Take 10 mg by mouth daily as needed for allergies.   ? cholestyramine (QUESTRAN) 4 GM/DOSE powder Take 4 g by mouth daily as needed (loose stools).   ? folic acid (FOLVITE) Q000111Q MCG tablet Take 800 mcg by mouth daily.  ? lisinopril (ZESTRIL) 20 MG tablet Take 0.5-1 tablets (10-20 mg total) by mouth daily as needed (if systolic bp is A999333 or greater).  ? nitroGLYCERIN (NITROSTAT) 0.4 MG SL tablet Place 1 tablet (0.4 mg total) under the tongue every 5 (five) minutes as needed for chest pain.  ? pantoprazole (PROTONIX) 40 MG tablet Take 40 mg by mouth daily.  ? Simethicone 125 MG CAPS Take 125 mg by mouth daily as needed (gas).  ? sodium chloride (OCEAN) 0.65 % SOLN nasal spray Place 1 spray into both nostrils as needed for congestion.  ? trolamine salicylate (ASPERCREME) 10 % cream Apply 1 application topically as needed for muscle pain.  ? vitamin B-12 (CYANOCOBALAMIN) 500 MCG tablet Take 500 mcg by mouth daily.  ? warfarin (COUMADIN) 5 MG tablet TAKE 1/2 TO 1 (ONE-HALF TO ONE) TABLET BY MOUTH DAILY AS DIRECTED BY COUMADIN CLINIC  ?  ? ?Allergies:   Ezetimibe; Fenofibrate micronized; Fish oil; Niacin; Simvastatin; and Tetanus toxoid, adsorbed  ? ?Social History  ? ?Socioeconomic History  ? Marital status: Married  ?  Spouse name: Not on file  ? Number of children: Not on file  ? Years of education: Not on file  ? Highest education level: Not on file  ?Occupational History  ? Not on file  ?Tobacco Use  ? Smoking status: Former  ?  Types: Cigarettes  ?  Passive exposure: Past  ? Smokeless tobacco: Never  ?Vaping Use  ? Vaping Use: Never used   ?Substance and Sexual Activity  ? Alcohol use: Not Currently  ? Drug use: Never  ? Sexual activity: Not on file  ?Other Topics Concern  ? Not on file  ?Social History Narrative  ? Not on file  ? ?Social Determinants of Health  ? ?Financial Resource Strain: Not on file  ?Food Insecurity: Not on file  ?Transportation Needs: Not on file  ?Physical Activity: Not on file  ?Stress: Not on file  ?Social Connections: Not on file  ?  ? ?Family History: ?The patient's family history includes Cancer in his father; Heart Problems in his brother and mother; Parkinson's disease in his brother; Stroke in his mother. ?ROS:   ?Please see the history of present illness.    ?All other systems reviewed and are negative. ? ?EKGs/Labs/Other Studies Reviewed:   ? ?The following studies were reviewed today: ? ? ? ?Recent Labs: ?No results found for requested labs within last 8760 hours.  ?Recent Lipid Panel ?   ?Component Value Date/Time  ? CHOL 171 04/28/2020 1043  ? TRIG  103 04/28/2020 1043  ? HDL 53 04/28/2020 1043  ? CHOLHDL 3.2 04/28/2020 1043  ? [place] 99 04/28/2020 1043  ? ? ?Physical Exam:   ? ?VS:  BP (!) 144/80   Pulse 95   Ht 5' 7.5" (1.715 m)   Wt 136 lb (61.7 kg)   SpO2 98%   BMI 20.99 kg/m?    ? ?Wt Readings from Last 3 Encounters:  ?05/02/21 136 lb (61.7 kg)  ?05/02/21 136 lb (61.7 kg)  ?10/29/20 134 lb 9.6 oz (61.1 kg)  ?  ? ?GEN: Appears younger than his age well nourished, well developed in no acute distress ?HEENT: Normal ?NECK: No JVD; No carotid bruits ?LYMPHATICS: No lymphadenopathy ?CARDIAC: RRR, no murmurs, rubs, gallops ?RESPIRATORY:  Clear to auscultation without rales, wheezing or rhonchi  ?ABDOMEN: Soft, non-tender, non-distended ?MUSCULOSKELETAL:  No edema; No deformity  ?SKIN: Warm and dry ?NEUROLOGIC:  Alert and oriented x 3 ?PSYCHIATRIC:  Normal affect  ? ? ?Signed, ?[name], MD  ?05/02/2021 1:34 PM    ?[place]  ?

## 2021-05-02 ENCOUNTER — Encounter: Payer: Self-pay | Admitting: Cardiology

## 2021-05-02 ENCOUNTER — Ambulatory Visit: Payer: Medicare HMO | Admitting: Cardiology

## 2021-05-02 ENCOUNTER — Ambulatory Visit (INDEPENDENT_AMBULATORY_CARE_PROVIDER_SITE_OTHER): Payer: Medicare HMO | Admitting: Cardiology

## 2021-05-02 VITALS — BP 144/80 | HR 95 | Ht 67.5 in | Wt 136.0 lb

## 2021-05-02 DIAGNOSIS — I10 Essential (primary) hypertension: Secondary | ICD-10-CM

## 2021-05-02 DIAGNOSIS — I442 Atrioventricular block, complete: Secondary | ICD-10-CM | POA: Diagnosis not present

## 2021-05-02 DIAGNOSIS — I25119 Atherosclerotic heart disease of native coronary artery with unspecified angina pectoris: Secondary | ICD-10-CM | POA: Diagnosis not present

## 2021-05-02 DIAGNOSIS — I480 Paroxysmal atrial fibrillation: Secondary | ICD-10-CM

## 2021-05-02 DIAGNOSIS — D6869 Other thrombophilia: Secondary | ICD-10-CM | POA: Diagnosis not present

## 2021-05-02 DIAGNOSIS — Z7901 Long term (current) use of anticoagulants: Secondary | ICD-10-CM | POA: Diagnosis not present

## 2021-05-02 DIAGNOSIS — Z789 Other specified health status: Secondary | ICD-10-CM

## 2021-05-02 DIAGNOSIS — I951 Orthostatic hypotension: Secondary | ICD-10-CM

## 2021-05-02 DIAGNOSIS — Z950 Presence of cardiac pacemaker: Secondary | ICD-10-CM

## 2021-05-02 DIAGNOSIS — E782 Mixed hyperlipidemia: Secondary | ICD-10-CM

## 2021-05-02 NOTE — Patient Instructions (Signed)

## 2021-05-02 NOTE — Patient Instructions (Signed)
Medication Instructions:  ?Your physician recommends that you continue on your current medications as directed. Please refer to the Current Medication list given to you today. ? ?*If you need a refill on your cardiac medications before your next appointment, please call your pharmacy* ? ? ?Lab Work: ?None ordered ? ? ?Testing/Procedures: ?None ordered ? ? ?Follow-Up: ?At [facility], you and your health needs are our priority.  As part of our continuing mission to provide you with exceptional heart care, we have created designated Provider Care Teams.  These Care Teams include your primary Cardiologist (physician) and Advanced Practice Providers (APPs -  Physician Assistants and Nurse Practitioners) who all work together to provide you with the care you need, when you need it. ? ?We recommend signing up for the patient portal called "MyChart".  Sign up information is provided on this After Visit Summary.  MyChart is used to connect with patients for Virtual Visits (Telemedicine).  Patients are able to view lab/test results, encounter notes, upcoming appointments, etc.  Non-urgent messages can be sent to your provider as well.   ?To learn more about what you can do with MyChart, go to [email or web address].   ? ?Remote monitoring is used to monitor your Pacemaker or ICD from home. This monitoring reduces the number of office visits required to check your device to one time per year. It allows [place] to keep an eye on the functioning of your device to ensure it is working properly. You are scheduled for a device check from home on 07/22/2021. You may send your transmission at any time that day. If you have a wireless device, the transmission will be sent automatically. After your physician reviews your transmission, you will receive a postcard with your next transmission date. ? ?Your next appointment:   ?1 year(s) ? ?The format for your next appointment:   ?In Person ? ?Provider:   ?Loman Brooklyn, MD ? ? ?Thank you  for choosing [facility]!! ? ? ?Dory Horn, RN ?[phone] ? ?  ?

## 2021-05-02 NOTE — Progress Notes (Signed)
? ?Electrophysiology Office Note ? ? ?Date:  05/02/2021  ? ?ID:  MY SIDEL, DOB 10/21/1925, MRN [id number] ? ?PCP:  Raina Mina., MD  ?Cardiologist:  Bettina Gavia ?Primary Electrophysiologist:  Makaelah Cranfield Meredith Leeds, MD   ? ?No chief complaint on file. ? ?  ?History of Present Illness: ?Greg Oconnor is a 88 y.o. male who is being seen today for the evaluation of cardiomyopathy at the request of Greg Oconnor. Presenting today for electrophysiology evaluation.   ? ?He has a history seen for coronary artery disease, hypertension, hyperlipidemia, complete heart block.  He is status post Public house manager. ? ?Today, denies symptoms of palpitations, chest pain, shortness of breath, orthopnea, PND, lower extremity edema, claudication, dizziness, presyncope, syncope, bleeding, or neurologic sequela. The patient is tolerating medications without difficulties.   ? ?Past Medical History:  ?Diagnosis Date  ? Anemia, chronic disease 05/12/2016  ? Anticoagulant long-term use 07/19/2015  ? Anticoagulated 06/17/2014  ? Atrioventricular block, complete ([place]) 12/31/2014  ? 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 BPH (benign prostatic hyperplasia) 07/20/2015  ? CAD, multiple vessel 07/20/2015  ? Cardiomyopathy ([place]) 07/20/2015  ? Chronic diarrhea 11/20/2018  ? Coronary artery disease involving native coronary artery of native heart with angina pectoris ([place]) 07/20/2015  ? Diabetes mellitus with stage 3 chronic kidney disease ([place]) 07/20/2015  ? Essential hypertension 07/20/2015  ? GERD without esophagitis 07/20/2015  ? Hepatitis A   ? History of pulmonary embolism 07/20/2015  ? Hypertensive heart disease without heart failure 06/17/2014  ? Mixed hyperlipidemia 07/20/2015  ? Old myocardial infarction 07/20/2015  ? 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 Orthostatic hypotension 07/20/2015  ? Pacemaker 06/17/2014  ? OLD ATRIAL LEAD failure  ? Pacemaker reprogramming/check 06/17/2014  ? Formatting of this note  might be different from the original. OLD ATRIAL LEAD failure  ? Paroxysmal atrial fibrillation ([place]) 05/31/2015  ? Pyuria 06/06/2019  ? Stage 3 chronic kidney disease ([place]) 07/20/2015  ? Statin intolerance 10/13/2018  ? Subconjunctival hemorrhage 07/20/2015  ? Type 2 diabetes mellitus without complication, without long-term current use of insulin ([place]) 07/20/2015  ? Vitamin B12 deficiency 07/20/2015  ? ?Past Surgical History:  ?Procedure Laterality Date  ? APPENDECTOMY    ? CHOLECYSTECTOMY    ? CORONARY ARTERY BYPASS GRAFT    ? CYSTOSCOPY    ? INSERT / REPLACE / REMOVE PACEMAKER    ? St. Jude  ? PPM GENERATOR CHANGEOUT N/A 10/25/2018  ? Procedure: PPM GENERATOR CHANGEOUT;  Surgeon: Constance Haw, MD;  Location: [facility];  Service: Cardiovascular;  Laterality: N/A;  ? PROSTATECTOMY    ? ? ? ?Current Outpatient Medications  ?Medication Sig Dispense Refill  ? acetaminophen (TYLENOL) 500 MG tablet Take 500 mg by mouth every 6 (six) hours as needed for moderate pain or headache.    ? cetirizine (ZYRTEC) 10 MG tablet Take 10 mg by mouth daily as needed for allergies.     ? cholestyramine (QUESTRAN) 4 GM/DOSE powder Take 4 g by mouth daily as needed (loose stools).     ? folic acid (FOLVITE) Q000111Q MCG tablet Take 800 mcg by mouth daily.    ? lisinopril (ZESTRIL) 20 MG tablet Take 0.5-1 tablets (10-20 mg total) by mouth daily as needed (if systolic bp is A999333 or greater). 90 tablet 3  ? nitroGLYCERIN (NITROSTAT) 0.4 MG SL tablet Place 1 tablet (0.4 mg total) under the tongue every 5 (five) minutes as needed for chest  pain. 30 tablet 0  ? pantoprazole (PROTONIX) 40 MG tablet Take 40 mg by mouth daily.    ? Simethicone 125 MG CAPS Take 125 mg by mouth daily as needed (gas).    ? sodium chloride (OCEAN) 0.65 % SOLN nasal spray Place 1 spray into both nostrils as needed for congestion.    ? trolamine salicylate (ASPERCREME) 10 % cream Apply 1 application topically as needed for muscle pain.    ? vitamin B-12  (CYANOCOBALAMIN) 500 MCG tablet Take 500 mcg by mouth daily.    ? warfarin (COUMADIN) 5 MG tablet TAKE 1/2 TO 1 (ONE-HALF TO ONE) TABLET BY MOUTH DAILY AS DIRECTED BY COUMADIN CLINIC 90 tablet 1  ? ?No current facility-administered medications for this visit.  ? ? ?Allergies:   Ezetimibe; Fenofibrate micronized; Fish oil; Niacin; Simvastatin; and Tetanus toxoid, adsorbed  ? ?Social History:  The patient  reports that he has quit smoking. His smoking use included cigarettes. He has never used smokeless tobacco. He reports that he does not currently use alcohol. He reports that he does not use drugs.  ? ?Family History:  The patient's family history includes Cancer in his father; Heart Problems in his brother and mother; Parkinson's disease in his brother; Stroke in his mother.  ? ?ROS:  Please see the history of present illness.   Otherwise, review of systems is positive for none.   All other systems are reviewed and negative.  ? ?PHYSICAL EXAM: ?VS:  BP (!) 144/80   Pulse 95   Ht 5' 7.5" (1.715 m)   Wt 136 lb (61.7 kg)   SpO2 98%   BMI 20.99 kg/m?  , BMI Body mass index is 20.99 kg/m?. ?GEN: Well nourished, well developed, in no acute distress  ?HEENT: normal  ?Neck: no JVD, carotid bruits, or masses ?Cardiac: RRR; no murmurs, rubs, or gallops,no edema  ?Respiratory:  clear to auscultation bilaterally, normal work of breathing ?GI: soft, nontender, nondistended, + BS ?MS: no deformity or atrophy  ?Skin: warm and dry, device site well healed ?Neuro:  Strength and sensation are intact ?Psych: euthymic mood, full affect ? ?EKG:  EKG is ordered today. ?Personal review of the ekg ordered shows A sense, V paced ? ?Personal review of the device interrogation today. Results in [place]  ? ? ?Recent Labs: ?No results found for requested labs within last 8760 hours.  ? ? ?Lipid Panel  ?   ?Component Value Date/Time  ? CHOL 171 04/28/2020 1043  ? TRIG 103 04/28/2020 1043  ? HDL 53 04/28/2020 1043  ? CHOLHDL 3.2 04/28/2020  1043  ? [place] 99 04/28/2020 1043  ? ? ? ?Wt Readings from Last 3 Encounters:  ?05/02/21 136 lb (61.7 kg)  ?10/29/20 134 lb 9.6 oz (61.1 kg)  ?04/28/20 136 lb 12.8 oz (62.1 kg)  ?  ? ? ?Other studies Reviewed: ?Additional studies/ records that were reviewed today include: TTE 04/24/2017 ?Review of the above records today demonstrates:  ?Ejection fraction 30%.  Left atrial enlargement.  No significant aortic stenosis.  Mild to moderate mitral regurgitation. ? ? ?ASSESSMENT AND PLAN: ? ?1.  Complete heart block: Status post Saint Jude dual-chamber pacemaker with generator change 10/25/2018.  Device functioning appropriately.  No changes at this time.  Has a chronically elevated right atrial lead threshold though he rarely paces. ? ?2.  Coronary artery disease: No current chest pain.  Plan per primary cardiology. ? ?3.  Hypertension: Mildly elevated today.  Usually well controlled.  No changes  at this time. ? ?4.  Paroxysmal atrial fibrillation: Currently on warfarin.  CHA2DS2-VASc of 4.  Minimal noted on device interrogation. ? ?5.  Secondary hypercoagulable state: Currently on warfarin for atrial fibrillation as above ? ?Current medicines are reviewed at length with the patient today.   ?The patient does not have concerns regarding his medicines.  The following changes were made today: none ? ?Labs/ tests ordered today include:  ?Orders Placed This Encounter  ?Procedures  ? EKG 12-Lead  ? ? ? ?Disposition:   FU with Perry Brucato 12 months ? ?Signed, ?Arben Packman Meredith Leeds, MD  ?05/02/2021 12:12 PM    ? ?[facility] ?[address] ?[address] ?[place] [address] ?[phone] (office) ?[phone] (fax) ?

## 2021-05-03 ENCOUNTER — Ambulatory Visit (INDEPENDENT_AMBULATORY_CARE_PROVIDER_SITE_OTHER): Payer: Medicare HMO

## 2021-05-03 DIAGNOSIS — I480 Paroxysmal atrial fibrillation: Secondary | ICD-10-CM | POA: Diagnosis not present

## 2021-05-03 DIAGNOSIS — Z5181 Encounter for therapeutic drug level monitoring: Secondary | ICD-10-CM | POA: Diagnosis not present

## 2021-05-03 LAB — POCT INR: INR: 3.6 — AB (ref 2.0–3.0)

## 2021-05-03 NOTE — Patient Instructions (Signed)
Description   ?Hold today's dose and then continue taking 1 tablet daily except 1/2 tablet Mondays. ?Repeat INR in 3 weeks. ?[facility] [phone] ?  ?   ?

## 2021-05-10 NOTE — Progress Notes (Signed)
Remote pacemaker transmission.   

## 2021-05-24 ENCOUNTER — Ambulatory Visit (INDEPENDENT_AMBULATORY_CARE_PROVIDER_SITE_OTHER): Payer: Medicare HMO

## 2021-05-24 DIAGNOSIS — I480 Paroxysmal atrial fibrillation: Secondary | ICD-10-CM | POA: Diagnosis not present

## 2021-05-24 DIAGNOSIS — Z5181 Encounter for therapeutic drug level monitoring: Secondary | ICD-10-CM | POA: Diagnosis not present

## 2021-05-24 LAB — POCT INR: INR: 1.9 — AB (ref 2.0–3.0)

## 2021-05-24 NOTE — Patient Instructions (Signed)
Description   ?Take 1.5 tablets today and then continue taking 1 tablet daily except 1/2 tablet Mondays. ?Repeat INR in 2 weeks. ?[facility] [phone] ?  ?   ?

## 2021-06-07 ENCOUNTER — Ambulatory Visit (INDEPENDENT_AMBULATORY_CARE_PROVIDER_SITE_OTHER): Payer: Medicare HMO

## 2021-06-07 DIAGNOSIS — I480 Paroxysmal atrial fibrillation: Secondary | ICD-10-CM | POA: Diagnosis not present

## 2021-06-07 DIAGNOSIS — Z5181 Encounter for therapeutic drug level monitoring: Secondary | ICD-10-CM

## 2021-06-07 LAB — POCT INR: INR: 3.9 — AB (ref 2.0–3.0)

## 2021-06-07 NOTE — Patient Instructions (Signed)
Description   Hold today's dose and then continue taking 1 tablet daily except 1/2 tablet Mondays. Stay consistent with greens each week (1-2 times per week)  Repeat INR in 2 weeks. Coumadin Clinic [phone]

## 2021-06-21 ENCOUNTER — Ambulatory Visit (INDEPENDENT_AMBULATORY_CARE_PROVIDER_SITE_OTHER): Payer: Medicare HMO

## 2021-06-21 DIAGNOSIS — Z5181 Encounter for therapeutic drug level monitoring: Secondary | ICD-10-CM

## 2021-06-21 DIAGNOSIS — I480 Paroxysmal atrial fibrillation: Secondary | ICD-10-CM | POA: Diagnosis not present

## 2021-06-21 LAB — POCT INR: INR: 1.5 — AB (ref 2.0–3.0)

## 2021-06-21 NOTE — Patient Instructions (Signed)
Description   Take 2 tablets today and 1.5 tablets tomorrow and then continue taking 1 tablet daily except 1/2 tablet Mondays. Stay consistent with greens each week (1-2 times per week)  Repeat INR in 2 weeks. [facility]madin Clinic [phone]

## 2021-07-05 ENCOUNTER — Ambulatory Visit (INDEPENDENT_AMBULATORY_CARE_PROVIDER_SITE_OTHER): Payer: Medicare HMO

## 2021-07-05 DIAGNOSIS — Z5181 Encounter for therapeutic drug level monitoring: Secondary | ICD-10-CM | POA: Diagnosis not present

## 2021-07-05 DIAGNOSIS — I480 Paroxysmal atrial fibrillation: Secondary | ICD-10-CM | POA: Diagnosis not present

## 2021-07-05 LAB — POCT INR: INR: 2.8 (ref 2.0–3.0)

## 2021-07-22 ENCOUNTER — Ambulatory Visit (INDEPENDENT_AMBULATORY_CARE_PROVIDER_SITE_OTHER): Payer: Medicare HMO

## 2021-07-22 DIAGNOSIS — I442 Atrioventricular block, complete: Secondary | ICD-10-CM | POA: Diagnosis not present

## 2021-07-22 LAB — CUP PACEART REMOTE DEVICE CHECK
Battery Remaining Longevity: 91 mo
Battery Remaining Percentage: 76 %
Battery Voltage: 3.01 V
Brady Statistic AS VP Percent: 98 %
Brady Statistic AS VS Percent: 1 %
Brady Statistic RV Percent Paced: 99 %
Date Time Interrogation Session: 20230714020011
Implantable Lead Implant Date: 19980122
Implantable Lead Implant Date: 20040315
Implantable Lead Location: 753859
Implantable Lead Location: 753860
Implantable Pulse Generator Implant Date: 20201016
Lead Channel Impedance Value: 390 Ohm
Lead Channel Impedance Value: 610 Ohm
Lead Channel Pacing Threshold Amplitude: 0.75 V
Lead Channel Pacing Threshold Pulse Width: 0.5 ms
Lead Channel Sensing Intrinsic Amplitude: 1.3 mV
Lead Channel Sensing Intrinsic Amplitude: 11.8 mV
Lead Channel Setting Pacing Amplitude: 2.5 V
Lead Channel Setting Pacing Pulse Width: 0.5 ms
Lead Channel Setting Sensing Sensitivity: 4 mV
Pulse Gen Model: 2272
Pulse Gen Serial Number: 9168465

## 2021-07-26 ENCOUNTER — Ambulatory Visit (INDEPENDENT_AMBULATORY_CARE_PROVIDER_SITE_OTHER): Payer: Medicare HMO

## 2021-07-26 DIAGNOSIS — I480 Paroxysmal atrial fibrillation: Secondary | ICD-10-CM | POA: Diagnosis not present

## 2021-07-26 DIAGNOSIS — Z7901 Long term (current) use of anticoagulants: Secondary | ICD-10-CM | POA: Diagnosis not present

## 2021-07-26 LAB — POCT INR: INR: 2.8 (ref 2.0–3.0)

## 2021-07-26 NOTE — Patient Instructions (Signed)
Description   Continue taking 1 tablet daily except 1/2 tablet Mondays. Stay consistent with greens each week (1-2 times per week)  Repeat INR in 4 weeks. [facility]madin Clinic [phone]

## 2021-08-04 NOTE — Progress Notes (Signed)
Remote pacemaker transmission.   

## 2021-08-23 ENCOUNTER — Ambulatory Visit (INDEPENDENT_AMBULATORY_CARE_PROVIDER_SITE_OTHER): Payer: Medicare HMO

## 2021-08-23 DIAGNOSIS — I480 Paroxysmal atrial fibrillation: Secondary | ICD-10-CM

## 2021-08-23 DIAGNOSIS — Z5181 Encounter for therapeutic drug level monitoring: Secondary | ICD-10-CM

## 2021-08-23 LAB — POCT INR: INR: 2.1 (ref 2.0–3.0)

## 2021-08-23 NOTE — Patient Instructions (Signed)
Description   Take 1.5 tablets today and then continue taking 1 tablet daily except 1/2 tablet Mondays. Stay consistent with greens each week (1-2 times per week)  Repeat INR in 3 weeks. Coumadin Clinic [phone]

## 2021-09-13 ENCOUNTER — Ambulatory Visit: Payer: Medicare HMO | Attending: Cardiology

## 2021-09-13 DIAGNOSIS — Z5181 Encounter for therapeutic drug level monitoring: Secondary | ICD-10-CM

## 2021-09-13 DIAGNOSIS — I480 Paroxysmal atrial fibrillation: Secondary | ICD-10-CM

## 2021-09-13 LAB — POCT INR: INR: 2.6 (ref 2.0–3.0)

## 2021-09-13 NOTE — Patient Instructions (Signed)
Description   Take 1.5 tablets today and then continue taking 1 tablet daily except 1/2 tablet Mondays. Stay consistent with greens each week (1-2 times per week)  Repeat INR in 4 weeks. [facility]in Clinic [phone]

## 2021-10-11 ENCOUNTER — Ambulatory Visit: Payer: Medicare HMO

## 2021-10-17 ENCOUNTER — Telehealth: Payer: Self-pay

## 2021-10-17 NOTE — Telephone Encounter (Signed)
Device alert for AF ongoing from 9/30 @ 15:09, controlled rates Hx of PAF, burden 5%, Warfarin Route for persistent AF.  Spoke to patient, reports hes feeling fine and has no compaints. Did miss his coumadin last week but gets his levels rechecked tomorrow 10/18/21. Advised I will forward to Dr. Lorne Skeens for review.

## 2021-10-18 ENCOUNTER — Ambulatory Visit: Payer: Medicare HMO | Attending: Cardiology

## 2021-10-18 DIAGNOSIS — I480 Paroxysmal atrial fibrillation: Secondary | ICD-10-CM | POA: Diagnosis not present

## 2021-10-18 DIAGNOSIS — Z5181 Encounter for therapeutic drug level monitoring: Secondary | ICD-10-CM | POA: Diagnosis not present

## 2021-10-18 LAB — POCT INR: INR: 1.8 — AB (ref 2.0–3.0)

## 2021-10-18 NOTE — Patient Instructions (Signed)
Description   Take 2 tablets today and then continue taking 1 tablet daily except 1/2 tablet Mondays. Stay consistent with greens each week (1-2 times per week)  Repeat INR in 2 weeks. Coumadin Clinic [phone]

## 2021-10-21 ENCOUNTER — Ambulatory Visit (INDEPENDENT_AMBULATORY_CARE_PROVIDER_SITE_OTHER): Payer: Medicare HMO

## 2021-10-21 DIAGNOSIS — I442 Atrioventricular block, complete: Secondary | ICD-10-CM | POA: Diagnosis not present

## 2021-10-21 LAB — CUP PACEART REMOTE DEVICE CHECK
Battery Remaining Longevity: 88 mo
Battery Remaining Percentage: 73 %
Battery Voltage: 3.01 V
Brady Statistic AS VP Percent: 98 %
Brady Statistic AS VS Percent: 1 %
Brady Statistic RV Percent Paced: 99 %
Date Time Interrogation Session: 20231013020014
Implantable Lead Implant Date: 19980122
Implantable Lead Implant Date: 20040315
Implantable Lead Location: 753859
Implantable Lead Location: 753860
Implantable Pulse Generator Implant Date: 20201016
Lead Channel Impedance Value: 380 Ohm
Lead Channel Impedance Value: 610 Ohm
Lead Channel Pacing Threshold Amplitude: 0.75 V
Lead Channel Pacing Threshold Pulse Width: 0.5 ms
Lead Channel Sensing Intrinsic Amplitude: 0.6 mV
Lead Channel Sensing Intrinsic Amplitude: 11.8 mV
Lead Channel Setting Pacing Amplitude: 2.5 V
Lead Channel Setting Pacing Pulse Width: 0.5 ms
Lead Channel Setting Sensing Sensitivity: 4 mV
Pulse Gen Model: 2272
Pulse Gen Serial Number: 9168465

## 2021-10-26 NOTE — Progress Notes (Signed)
Remote pacemaker transmission.   

## 2021-11-01 ENCOUNTER — Telehealth: Payer: Self-pay | Admitting: Cardiology

## 2021-11-01 ENCOUNTER — Ambulatory Visit: Payer: Medicare HMO | Attending: Cardiology

## 2021-11-01 DIAGNOSIS — I480 Paroxysmal atrial fibrillation: Secondary | ICD-10-CM | POA: Diagnosis not present

## 2021-11-01 DIAGNOSIS — Z7901 Long term (current) use of anticoagulants: Secondary | ICD-10-CM | POA: Diagnosis not present

## 2021-11-01 LAB — POCT INR: INR: 5.7 — AB (ref 2.0–3.0)

## 2021-11-01 NOTE — Telephone Encounter (Signed)
Left message for patient's daughter Fraser Din to callback.  I am unsure whose call she is returning, no notes in chart recently.  Provided office number for callback.

## 2021-11-01 NOTE — Patient Instructions (Addendum)
Description   Hold today's dose and only take 0.5 tablet tomorrow and then continue taking 1 tablet daily except 1/2 tablet Mondays. Stay consistent with greens each week (1-2 times per week)  Repeat INR in 1 week. Coumadin Clinic [phone]

## 2021-11-01 NOTE — Telephone Encounter (Signed)
Daughter returned RN's call. 

## 2021-11-01 NOTE — Telephone Encounter (Signed)
Received call from patient's daughter Greg Oconnor.  Greg Oconnor states she is unsure who called, message was erased. No note in chart regarding recent call to patient. Informed Greg Oconnor that if someone from our office is needing to get in contact with patient they will call again.  Pat verbalized understanding and expressed appreciation for follow-up.

## 2021-11-01 NOTE — Telephone Encounter (Signed)
Attempted to call patient to check on s/s per Dr. Curt Bears. No answer, LMTCB.

## 2021-11-04 NOTE — Telephone Encounter (Signed)
Patient spoke with Dr. Curt Bears nurse on 10/20 (per appt schedule note history).  Appointment made with Dr. Curt Bears in the next 2 weeks on 11/14/21.

## 2021-11-08 ENCOUNTER — Ambulatory Visit: Payer: Medicare HMO | Attending: Cardiology

## 2021-11-08 DIAGNOSIS — I480 Paroxysmal atrial fibrillation: Secondary | ICD-10-CM

## 2021-11-08 DIAGNOSIS — Z5181 Encounter for therapeutic drug level monitoring: Secondary | ICD-10-CM | POA: Diagnosis not present

## 2021-11-08 LAB — POCT INR: INR: 3.4 — AB (ref 2.0–3.0)

## 2021-11-08 NOTE — Patient Instructions (Signed)
Description   Only take 0.5 tablet today and then START taking 1 tablet daily except 1/2 tablet Mondays and Fridays.  Stay consistent with greens each week (1-2 times per week)  Repeat INR in 1 week. Coumadin Clinic [phone]

## 2021-11-13 NOTE — Progress Notes (Unsigned)
Electrophysiology Office Note   Date:  11/14/2021   ID:  Greg Oconnor, DOB 1925/02/04, MRN [id number]  PCP:  [name], MD  Cardiologist:  Dulce Sellar Primary Electrophysiologist:  Regan Lemming, MD    No chief complaint on file.    History of Present Illness: Greg Oconnor is a 88 y.o. male who is being seen today for the evaluation of cardiomyopathy at the request of [name]. Presenting today for electrophysiology evaluation.    Has a history seen for coronary artery disease, hypertension, hyperlipidemia, complete heart block.  He is status post Printmaker.  Most recent reviewed device interrogation shows persistent atrial fibrillation.  Today, denies symptoms of palpitations, chest pain, orthopnea, PND, lower extremity edema, claudication, dizziness, presyncope, syncope, bleeding, or neurologic sequela. The patient is tolerating medications without difficulties.  Today his main symptoms are fatigue and shortness of breath.  Review of device interrogation showed that he has been in atrial fibrillation for the past few weeks.  His symptoms started around the time when he went into atrial fibrillation.   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 07/19/2015   BPH (benign prostatic hyperplasia) 07/20/2015   CAD, multiple vessel 07/20/2015   Cardiomyopathy (HCC) 07/20/2015   Chronic diarrhea 11/20/2018   Coronary artery disease involving native coronary artery of native heart with angina pectoris (HCC) 07/20/2015   Diabetes mellitus with stage 3 chronic kidney disease (HCC) 07/20/2015   Essential hypertension 07/20/2015   GERD without esophagitis 07/20/2015   Hepatitis A    History of pulmonary embolism 07/20/2015   Hypertensive  heart disease without heart failure 06/17/2014   Mixed hyperlipidemia 07/20/2015   Old myocardial infarction 07/20/2015   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 OLD ATRIAL LEAD failure   Paroxysmal atrial fibrillation (HCC) 05/31/2015   Pyuria 06/06/2019   Stage 3 chronic kidney disease (HCC) 07/20/2015   Statin intolerance 10/13/2018   Subconjunctival hemorrhage 07/20/2015   Type 2 diabetes mellitus without complication, without long-term current use of insulin (HCC) 07/20/2015   Vitamin B12 deficiency 07/20/2015   Past Surgical History:  Procedure Laterality Date   APPENDECTOMY     CHOLECYSTECTOMY     CORONARY ARTERY BYPASS GRAFT     CYSTOSCOPY     INSERT / REPLACE / REMOVE PACEMAKER     St. Jude   PPM GENERATOR CHANGEOUT N/A 10/25/2018   Procedure: PPM GENERATOR CHANGEOUT;  Surgeon: Regan Lemming, MD;  Location: [facility];  Service: Cardiovascular;  Laterality: N/A;   PROSTATECTOMY       Current Outpatient Medications  Medication Sig Dispense Refill   acetaminophen (TYLENOL) 500 MG tablet Take 500 mg by mouth every 6 (six) hours as needed for moderate pain or headache.     amiodarone (PACERONE) 200 MG tablet Take 2 tablets TWICE daily for 2 weeks, then take 1 tablet TWICE daily for 2 weeks, then take 1 tablet ONCE daily 84 tablet 0   [START ON 12/14/2021] amiodarone (PACERONE) 200 MG tablet Take 1 tablet (200 mg total) by mouth daily. 90 tablet 1   cetirizine (ZYRTEC) 10 MG tablet  Take 10 mg by mouth daily as needed for allergies.      cholestyramine (QUESTRAN) 4 GM/DOSE powder Take 4 g by mouth daily as needed (loose stools).      folic acid (FOLVITE) 800 MCG tablet Take 800 mcg by mouth daily.     lisinopril (ZESTRIL) 20 MG tablet Take 0.5-1 tablets (10-20 mg total) by mouth daily as needed (if  systolic bp is 135 or greater). 90 tablet 3   nitroGLYCERIN (NITROSTAT) 0.4 MG SL tablet Place 1 tablet (0.4 mg total) under the tongue every 5 (five) minutes as needed for chest pain. 30 tablet 0   pantoprazole (PROTONIX) 40 MG tablet Take 40 mg by mouth daily.     Simethicone 125 MG CAPS Take 125 mg by mouth daily as needed (gas).     sodium chloride (OCEAN) 0.65 % SOLN nasal spray Place 1 spray into both nostrils as needed for congestion.     trolamine salicylate (ASPERCREME) 10 % cream Apply 1 application topically as needed for muscle pain.     vitamin B-12 (CYANOCOBALAMIN) 500 MCG tablet Take 500 mcg by mouth daily.     warfarin (COUMADIN) 5 MG tablet TAKE 1/2 TO 1 (ONE-HALF TO ONE) TABLET BY MOUTH DAILY AS DIRECTED BY COUMADIN CLINIC 90 tablet 1   No current facility-administered medications for this visit.    Allergies:   Ezetimibe; Fenofibrate micronized; Fish oil; Niacin; Simvastatin; and Tetanus toxoid, adsorbed   Social History:  The patient  reports that he has quit smoking. His smoking use included cigarettes. He has been exposed to tobacco smoke. He has never used smokeless tobacco. He reports that he does not currently use alcohol. He reports that he does not use drugs.   Family History:  The patient's family history includes Cancer in his father; Heart Problems in his brother and mother; Parkinson's disease in his brother; Stroke in his mother.   ROS:  Please see the history of present illness.   Otherwise, review of systems is positive for none.   All other systems are reviewed and negative.   PHYSICAL EXAM: VS:  BP 118/68   Pulse 70   Ht 5' 7.5" (1.715 m)   Wt 139 lb (63 kg)   SpO2 98%   BMI 21.45 kg/m  , BMI Body mass index is 21.45 kg/m. GEN: Well nourished, well developed, in no acute distress  HEENT: normal  Neck: no JVD, carotid bruits, or masses Cardiac: RRR; no murmurs, rubs, or gallops,no edema  Respiratory:  clear to auscultation bilaterally, normal work  of breathing GI: soft, nontender, nondistended, + BS MS: no deformity or atrophy  Skin: warm and dry, device site well healed Neuro:  Strength and sensation are intact Psych: euthymic mood, full affect  EKG:  EKG is ordered today. Personal review of the ekg ordered shows AF, V paced  Personal review of the device interrogation today. Results in [facility]   Recent Labs: No results found for requested labs within last 365 days.    Lipid Panel     Component Value Date/Time   CHOL 171 04/28/2020 1043   TRIG 103 04/28/2020 1043   HDL 53 04/28/2020 1043   CHOLHDL 3.2 04/28/2020 1043   LDLCALC 99 04/28/2020 1043     Wt Readings from Last 3 Encounters:  11/14/21 139 lb (63 kg)  05/02/21 136 lb (61.7 kg)  05/02/21 136 lb (61.7 kg)      Other studies Reviewed: Additional studies/ records that were reviewed today  include: TTE 04/24/2017 Review of the above records today demonstrates:  Ejection fraction 30%.  Left atrial enlargement.  No significant aortic stenosis.  Mild to moderate mitral regurgitation.   ASSESSMENT AND PLAN:  1.  Complete heart block: Status post Saint Jude dual-chamber pacemaker with generator change 10/15/2018.  Device function appropriately.  No changes at this time.  Has chronically elevated RA threshold though rarely paces.  2.  Coronary artery disease: No current chest pain.  Plan per primary cardiology.  3.  Hypertension: Currently well controlled  4.  Persistent atrial fibrillation: Currently on warfarin.  CHA2DS2-VASc of 4.  Unfortunately he remains in atrial fibrillation.  He Shantell Belongia need a rhythm control strategy as he is feeling quite poorly.  We Zya Finkle start him on amiodarone today.  We Alonia Dibuono plan for cardioversion in 2 weeks.  5.  Secondary hypercoagulable state: Currently on warfarin for atrial fibrillation as above.   Current medicines are reviewed at length with the patient today.   The patient does not have concerns regarding his medicines.  The  following changes were made today: Start amiodarone  Labs/ tests ordered today include:  Orders Placed This Encounter  Procedures   EKG 12-Lead     Disposition:   FU with Cherolyn Behrle 3 months  Signed, Ramia Sidney Meredith Leeds, MD  11/14/2021 1:07 PM     [address] [place] [address] [phone] (office) [phone] (fax)

## 2021-11-14 ENCOUNTER — Telehealth: Payer: Self-pay

## 2021-11-14 ENCOUNTER — Encounter: Payer: Self-pay | Admitting: *Deleted

## 2021-11-14 ENCOUNTER — Ambulatory Visit: Payer: Medicare HMO | Attending: Cardiology | Admitting: Cardiology

## 2021-11-14 ENCOUNTER — Encounter: Payer: Self-pay | Admitting: Cardiology

## 2021-11-14 VITALS — BP 118/68 | HR 70 | Ht 67.5 in | Wt 139.0 lb

## 2021-11-14 DIAGNOSIS — I442 Atrioventricular block, complete: Secondary | ICD-10-CM

## 2021-11-14 DIAGNOSIS — D6869 Other thrombophilia: Secondary | ICD-10-CM | POA: Diagnosis not present

## 2021-11-14 DIAGNOSIS — I480 Paroxysmal atrial fibrillation: Secondary | ICD-10-CM

## 2021-11-14 LAB — CUP PACEART INCLINIC DEVICE CHECK
Battery Remaining Longevity: 82 mo
Battery Voltage: 3.01 V
Brady Statistic RA Percent Paced: 0 %
Brady Statistic RV Percent Paced: 99.25 %
Date Time Interrogation Session: 20231106132641
Implantable Lead Connection Status: 753985
Implantable Lead Connection Status: 753985
Implantable Lead Implant Date: 19980122
Implantable Lead Implant Date: 20040315
Implantable Lead Location: 753859
Implantable Lead Location: 753860
Implantable Pulse Generator Implant Date: 20201016
Lead Channel Impedance Value: 387.5 Ohm
Lead Channel Impedance Value: 650 Ohm
Lead Channel Pacing Threshold Amplitude: 0.5 V
Lead Channel Pacing Threshold Amplitude: 0.5 V
Lead Channel Pacing Threshold Pulse Width: 0.5 ms
Lead Channel Pacing Threshold Pulse Width: 0.5 ms
Lead Channel Sensing Intrinsic Amplitude: 0.7 mV
Lead Channel Sensing Intrinsic Amplitude: 8 mV
Lead Channel Setting Pacing Amplitude: 2.5 V
Lead Channel Setting Pacing Pulse Width: 0.5 ms
Lead Channel Setting Sensing Sensitivity: 4 mV
Pulse Gen Model: 2272
Pulse Gen Serial Number: 9168465

## 2021-11-14 MED ORDER — AMIODARONE HCL 200 MG PO TABS: 200.0000 mg | ORAL_TABLET | Freq: Every day | ORAL | 1 refills | Status: DC

## 2021-11-14 MED ORDER — AMIODARONE HCL 200 MG PO TABS: ORAL_TABLET | ORAL | 0 refills | Status: DC

## 2021-11-14 NOTE — Telephone Encounter (Addendum)
Pt has scheduled appt with anticoagulation clinic tomorrow; however, pt has appt with Dr Curt Bears today and requested to go to lab for PT/INR so he does not have to come back to the office tomorrow. PT/INR lab placed and [place] office aware that pt should go to lab at today's office visit. Will address INR when labs have resulted.

## 2021-11-14 NOTE — Patient Instructions (Signed)
Medication Instructions:  Your physician has recommended you make the following change in your medication:  START Amiodarone  - take 2 tablets (400 mg total) TWICE a day for 2 weeks, then  - take 1 tablet (200 mg total) TWICE a day for 2 weeks, then  - take 1 tablet (200 mg total) ONCE a day  *If you need a refill on your cardiac medications before your next appointment, please call your pharmacy*   Lab Work: None ordered   Testing/Procedures: Your physician has recommended that you have a Cardioversion (DCCV). Electrical Cardioversion uses a jolt of electricity to your heart either through paddles or wired patches attached to your chest. This is a controlled, usually prescheduled, procedure. Defibrillation is done under light anesthesia in the hospital, and you usually go home the day of the procedure. This is done to get your heart back into a normal rhythm. You are not awake for the procedure. Please see the instruction sheet given to you today.   Follow-Up: At [facility], you and your health needs are our priority.  As part of our continuing mission to provide you with exceptional heart care, we have created designated Provider Care Teams.  These Care Teams include your primary Cardiologist (physician) and Advanced Practice Providers (APPs -  Physician Assistants and Nurse Practitioners) who all work together to provide you with the care you need, when you need it.  Remote monitoring is used to monitor your Pacemaker or ICD from home. This monitoring reduces the number of office visits required to check your device to one time per year. It allows [place] to keep an eye on the functioning of your device to ensure it is working properly. You are scheduled for a device check from home on 01/10/2022. You may send your transmission at any time that day. If you have a wireless device, the transmission will be sent automatically. After your physician reviews your transmission, you will receive a  postcard with your next transmission date.  Your next appointment:   3 month(s)  The format for your next appointment:   In Person  Provider:   Allegra Lai, MD    Thank you for choosing [facility]!!   Trinidad Curet, RN [phone]    Other Instructions  Important Information About Sugar

## 2021-11-15 ENCOUNTER — Ambulatory Visit (INDEPENDENT_AMBULATORY_CARE_PROVIDER_SITE_OTHER): Payer: Medicare HMO

## 2021-11-15 DIAGNOSIS — I480 Paroxysmal atrial fibrillation: Secondary | ICD-10-CM | POA: Diagnosis not present

## 2021-11-15 DIAGNOSIS — Z5181 Encounter for therapeutic drug level monitoring: Secondary | ICD-10-CM

## 2021-11-15 LAB — PROTIME-INR
INR: 3.6 — ABNORMAL HIGH (ref 0.9–1.2)
Prothrombin Time: 35.9 s — ABNORMAL HIGH (ref 9.1–12.0)

## 2021-11-15 NOTE — Patient Instructions (Signed)
Description   Hold today's dose and then START taking 1 tablet daily except 1/2 tablet Mondays, Wednesdays and Fridays.  Stay consistent with greens each week (1-2 times per week)  Repeat INR in 1 week. Coumadin Clinic [phone] (Started Amio on 11/14/21)

## 2021-11-22 ENCOUNTER — Telehealth: Payer: Self-pay | Admitting: Cardiology

## 2021-11-22 ENCOUNTER — Ambulatory Visit: Payer: Medicare HMO | Attending: Cardiology

## 2021-11-22 DIAGNOSIS — Z5181 Encounter for therapeutic drug level monitoring: Secondary | ICD-10-CM

## 2021-11-22 DIAGNOSIS — I480 Paroxysmal atrial fibrillation: Secondary | ICD-10-CM

## 2021-11-22 LAB — PROTIME-INR
INR: 9.8 (ref 0.9–1.2)
Prothrombin Time: 87.6 s — ABNORMAL HIGH (ref 9.1–12.0)

## 2021-11-22 NOTE — Telephone Encounter (Signed)
Refer to anticoagulation encounter

## 2021-11-22 NOTE — Telephone Encounter (Signed)
Marchelle Folks calling with critical INR results

## 2021-11-22 NOTE — Patient Instructions (Signed)
Description   STAT INR lab results: 9.8  Called and spoke with pt's daughter Olegario Messier). Instructed for pt to HOLD Warfarin for 4 days and then START taking 0.5 tablet daily on Saturday (11/18) If signs or symptoms of bleeding occur, seek immediate medical attention.  Stay consistent with greens each week (1-2 times per week)  Repeat INR on Tuesday (11/29/21)  Coumadin Clinic [phone] (Started Amio on 11/14/21)

## 2021-11-22 NOTE — Telephone Encounter (Signed)
Received a call from [facility] with a critical value INR 9.8 for this patient. Abigail from the coumadin clinic was at the [facility] office seeing coumadin patients so I informed her of the patients critical result. She stated that she would handle it.

## 2021-11-24 ENCOUNTER — Telehealth: Payer: Self-pay | Admitting: Cardiology

## 2021-11-24 NOTE — Telephone Encounter (Signed)
Patient states he returning call bout his procedure on 12/1. Please advise

## 2021-11-24 NOTE — Telephone Encounter (Signed)
Spoke with pt and advised RN does not see that it was this office that called pt.  Pt verbalizes understanding and thanked Charity fundraiser for the call.

## 2021-11-29 ENCOUNTER — Telehealth: Payer: Self-pay

## 2021-11-29 ENCOUNTER — Ambulatory Visit: Payer: Medicare HMO | Attending: Cardiology

## 2021-11-29 DIAGNOSIS — I480 Paroxysmal atrial fibrillation: Secondary | ICD-10-CM

## 2021-11-29 DIAGNOSIS — Z5181 Encounter for therapeutic drug level monitoring: Secondary | ICD-10-CM

## 2021-11-29 LAB — POCT INR: INR: 3.1 — AB (ref 2.0–3.0)

## 2021-11-29 NOTE — Patient Instructions (Signed)
Description   START taking 0.5 tablet daily and 1 tablet on Wednesdays and Saturdays.  Stay consistent with greens each week (1-2 times per week)  Repeat INR in 1 week  Coumadin Clinic [phone] (Started Amio on 11/14/21)

## 2021-11-29 NOTE — Telephone Encounter (Signed)
Pt seen today in Coumadin Clinic with two of his daughters Olegario Messier & Dennie Bible). After checking pt's INR, family voiced concern that pt has been dizzy, short of breath/wheezing, confused at times, fatigued and night time incontinence.  Pt is scheduled for DCCV on 12/09/21 and was also recently started on Amiodarone.  Pt's daughter, Olegario Messier also voiced concern that she knew the office would be closed Thursday and Friday and wanted to see if someone could call them as soon as possible.   Best contact number: [phone]

## 2021-11-29 NOTE — Telephone Encounter (Signed)
Spoke to dtr. Pt experiencing worsening SOB, dizziness and wobbliness. Discussed w/ MD. Advised  to decrease Amiodarone to once daily. Advised to contact office if symptoms do not improve/worsen. Dtr verbalized understanding and agreeable to plan.

## 2021-12-03 DIAGNOSIS — Z7901 Long term (current) use of anticoagulants: Secondary | ICD-10-CM

## 2021-12-03 DIAGNOSIS — I4892 Unspecified atrial flutter: Secondary | ICD-10-CM | POA: Diagnosis not present

## 2021-12-03 DIAGNOSIS — I5043 Acute on chronic combined systolic (congestive) and diastolic (congestive) heart failure: Secondary | ICD-10-CM

## 2021-12-03 DIAGNOSIS — I340 Nonrheumatic mitral (valve) insufficiency: Secondary | ICD-10-CM | POA: Diagnosis not present

## 2021-12-03 DIAGNOSIS — Z950 Presence of cardiac pacemaker: Secondary | ICD-10-CM

## 2021-12-04 DIAGNOSIS — I4892 Unspecified atrial flutter: Secondary | ICD-10-CM | POA: Diagnosis not present

## 2021-12-04 DIAGNOSIS — I5043 Acute on chronic combined systolic (congestive) and diastolic (congestive) heart failure: Secondary | ICD-10-CM | POA: Diagnosis not present

## 2021-12-04 DIAGNOSIS — Z950 Presence of cardiac pacemaker: Secondary | ICD-10-CM | POA: Diagnosis not present

## 2021-12-04 DIAGNOSIS — Z7901 Long term (current) use of anticoagulants: Secondary | ICD-10-CM | POA: Diagnosis not present

## 2021-12-05 DIAGNOSIS — I10 Essential (primary) hypertension: Secondary | ICD-10-CM | POA: Diagnosis not present

## 2021-12-05 DIAGNOSIS — I5043 Acute on chronic combined systolic (congestive) and diastolic (congestive) heart failure: Secondary | ICD-10-CM | POA: Diagnosis not present

## 2021-12-05 DIAGNOSIS — Z950 Presence of cardiac pacemaker: Secondary | ICD-10-CM

## 2021-12-05 DIAGNOSIS — Z7901 Long term (current) use of anticoagulants: Secondary | ICD-10-CM | POA: Diagnosis not present

## 2021-12-05 DIAGNOSIS — I4892 Unspecified atrial flutter: Secondary | ICD-10-CM | POA: Diagnosis not present

## 2021-12-06 ENCOUNTER — Telehealth: Payer: Self-pay

## 2021-12-06 ENCOUNTER — Ambulatory Visit: Payer: Medicare HMO

## 2021-12-06 DIAGNOSIS — I4892 Unspecified atrial flutter: Secondary | ICD-10-CM | POA: Diagnosis not present

## 2021-12-06 DIAGNOSIS — I10 Essential (primary) hypertension: Secondary | ICD-10-CM | POA: Diagnosis not present

## 2021-12-06 DIAGNOSIS — Z7901 Long term (current) use of anticoagulants: Secondary | ICD-10-CM | POA: Diagnosis not present

## 2021-12-06 DIAGNOSIS — I5043 Acute on chronic combined systolic (congestive) and diastolic (congestive) heart failure: Secondary | ICD-10-CM | POA: Diagnosis not present

## 2021-12-06 NOTE — Telephone Encounter (Signed)
Received message from [facility] stating pt's daughter had called requesting to speak with [facility]. Returned call, no answer. Left message on voicemail with call back number.  Pt's coumadin clinic appt was also canceled today, will determine why and reschedule when daughter returns call if appropriate.

## 2021-12-06 NOTE — Telephone Encounter (Signed)
Called pt and pt's daughters. No answer. Left message on voicemail concerning pt canceling his anticoagulation appt today.

## 2021-12-06 NOTE — Telephone Encounter (Signed)
Received call back from pt's daughter, Dennie Bible stating pt is currently hospitalized at [facility].    Daughter requesting someone from Dr [employer] office call her to discuss his medication changes and make them aware that a DCCV was performed at [facility].

## 2021-12-07 DIAGNOSIS — I10 Essential (primary) hypertension: Secondary | ICD-10-CM | POA: Diagnosis not present

## 2021-12-07 DIAGNOSIS — I4892 Unspecified atrial flutter: Secondary | ICD-10-CM | POA: Diagnosis not present

## 2021-12-07 DIAGNOSIS — I5043 Acute on chronic combined systolic (congestive) and diastolic (congestive) heart failure: Secondary | ICD-10-CM | POA: Diagnosis not present

## 2021-12-07 DIAGNOSIS — Z7901 Long term (current) use of anticoagulants: Secondary | ICD-10-CM | POA: Diagnosis not present

## 2021-12-07 NOTE — Telephone Encounter (Signed)
Spoke to dtr Aware I will get 12/1 DCCV scheduled at [facility] cancelled. Dtr  verbalized understanding and agreeable to plan.

## 2021-12-08 ENCOUNTER — Telehealth: Payer: Self-pay

## 2021-12-08 NOTE — Telephone Encounter (Signed)
Cancelled DCCV w/ [facility].

## 2021-12-08 NOTE — Telephone Encounter (Signed)
Received call from pt's daughter Greg Oconnor stating pt was discharged from [facility]. His Warfarin was discontinued and he was prescribed Eliquis 2.5mg . Made Greg Oconnor aware that pt no longer needs to come to anticoagulation clinic to have INR checked.   Greg Oconnor verbalized understanding and stated the pt appreciated all the anticoagulation clinic has done for him.

## 2021-12-09 ENCOUNTER — Encounter (HOSPITAL_COMMUNITY): Admission: RE | Payer: Self-pay | Source: Ambulatory Visit

## 2021-12-09 ENCOUNTER — Ambulatory Visit (HOSPITAL_COMMUNITY): Admission: RE | Admit: 2021-12-09 | Payer: Medicare HMO | Source: Ambulatory Visit | Admitting: Cardiovascular Disease

## 2021-12-09 DIAGNOSIS — I480 Paroxysmal atrial fibrillation: Secondary | ICD-10-CM

## 2021-12-09 SURGERY — CARDIOVERSION
Anesthesia: Monitor Anesthesia Care

## 2021-12-14 DIAGNOSIS — I5022 Chronic systolic (congestive) heart failure: Secondary | ICD-10-CM

## 2021-12-14 HISTORY — DX: Chronic systolic (congestive) heart failure: I50.22

## 2021-12-24 NOTE — Progress Notes (Unsigned)
Cardiology Office Note:    Date:  12/26/2021   ID:  Greg Oconnor, DOB 08-19-1925, MRN [id number]  PCP:  [name], MD  Cardiologist:  [name], MD    Referring MD: [name], MD    ASSESSMENT:    1. Atrial flutter, unspecified type (HCC)   2. Paroxysmal atrial fibrillation (HCC)   3. Anticoagulant long-term use   4. Coronary artery disease involving native coronary artery of native heart with angina pectoris (HCC)   5. Ischemic cardiomyopathy   6. Complete AV block (HCC)   7. Pacemaker   8. Essential hypertension   9. Orthostatic hypotension   10. Mixed hyperlipidemia   11. Stage 3b chronic kidney disease (HCC)    PLAN:    In order of problems listed above:  Fortunately is maintaining sinus rhythm paced on low-dose amiodarone continue the same currently not anticoagulated Stable CAD He has cardiomyopathy and heart failure recent hospitalization he is improved no evidence of fluid overload I will have him take diuretic on a regular basis and extra as needed as needed.  Options would be to intensify therapy starting other medications like Entresto however with the symptomatic orthostatic symptoms I think we should continue taking lisinopril checking and following home blood pressures. continue spironolactone He will continue his nonstatin therapy Stable CKD   Next appointment: 3 months   Medication Adjustments/Labs and Tests Ordered: Current medicines are reviewed at length with the patient today.  Concerns regarding medicines are outlined above.  No orders of the defined types were placed in this encounter.  No orders of the defined types were placed in this encounter. Chief complaint follow-up for heart failure recent hospitalization   History of Present Illness:    BENTLEIGH Oconnor is a 88 y.o. male with a hx of CAD with remote CABG in 2005 hypertensive heart disease with symptomatic orthostatic hypotension hyperlipidemia statin intolerance  bradycardia with heart block and permanent pacemaker followed in our device clinic last seen by me 05/02/2021.  He is admitted to [facility] underwent inpatient cardioversion 12/06/2021 to sinus rhythm with symptomatic atrial flutter echocardiogram showed EF of 25 to 30%.  He was maintained on low-dose amiodarone.  Compliance with diet, lifestyle and medications: Yes  His daughters are present participated in the evaluation and decision making. He will his daily has been taking furosemide as needed instead of that longer and have him take it on Monday Wednesday Friday a day in between he gets his weight of 138 pounds or greater. 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 07/19/2015   BPH (benign prostatic hyperplasia) 07/20/2015   CAD, multiple vessel 07/20/2015   Cardiomyopathy (HCC) 07/20/2015   Chronic diarrhea 11/20/2018   Coronary artery disease involving native coronary artery of native heart with angina pectoris (HCC) 07/20/2015   Diabetes mellitus with stage 3 chronic kidney disease (HCC) 07/20/2015   Essential hypertension 07/20/2015   GERD without esophagitis 07/20/2015   Hepatitis A    History of pulmonary embolism 07/20/2015   Hypertensive heart disease without heart  failure 06/17/2014   Mixed hyperlipidemia 07/20/2015   Old myocardial infarction 07/20/2015   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 OLD ATRIAL LEAD failure   Paroxysmal atrial fibrillation (HCC) 05/31/2015   Pyuria 06/06/2019   Stage 3 chronic kidney disease (HCC) 07/20/2015   Statin intolerance 10/13/2018   Subconjunctival hemorrhage 07/20/2015   Type 2 diabetes mellitus without complication, without long-term current use of insulin (HCC) 07/20/2015   Vitamin B12 deficiency 07/20/2015    Past Surgical History:  Procedure Laterality Date   APPENDECTOMY     CHOLECYSTECTOMY     CORONARY ARTERY BYPASS GRAFT     CYSTOSCOPY     INSERT / REPLACE / REMOVE PACEMAKER     St. Jude   PPM GENERATOR CHANGEOUT N/A 10/25/2018   Procedure: PPM GENERATOR CHANGEOUT;  Surgeon: Regan Lemming, MD;  Location: [facility];  Service: Cardiovascular;  Laterality: N/A;   PROSTATECTOMY      Current Medications: Current Meds  Medication Sig   acetaminophen (TYLENOL) 500 MG tablet Take 500 mg by mouth every 6 (six) hours as needed for moderate pain or headache.   amiodarone (PACERONE) 200 MG tablet Take 1 tablet (200 mg total) by mouth daily.   cetirizine (ZYRTEC) 10 MG tablet Take 10 mg by mouth daily as needed for allergies.    cholestyramine (QUESTRAN) 4 GM/DOSE powder Take 4 g by mouth daily as needed (loose stools).    folic acid (FOLVITE) 800 MCG tablet Take 800 mcg by mouth daily.   lisinopril (ZESTRIL) 20 MG tablet Take 0.5-1 tablets (10-20 mg total) by mouth daily as needed (if systolic bp is 135 or greater).   nitroGLYCERIN (NITROSTAT) 0.4 MG SL tablet Place 1 tablet (0.4 mg total) under the tongue every 5 (five) minutes as needed for chest pain.   Simethicone 125 MG CAPS Take 125 mg by mouth daily as needed (gas).   sodium chloride (OCEAN) 0.65 % SOLN nasal spray Place 1 spray into both nostrils as needed for congestion.   trolamine salicylate (ASPERCREME) 10 % cream Apply 1 application topically as needed for muscle pain.    vitamin B-12 (CYANOCOBALAMIN) 500 MCG tablet Take 500 mcg by mouth daily.     Allergies:   Ezetimibe; Fenofibrate micronized; Fish oil; Niacin; Promethazine; Simvastatin; and Tetanus toxoid, adsorbed   Social History   Socioeconomic History   Marital status: Married    Spouse name: Not on file   Number of children: Not on file   Years of education: Not on file   Highest education level: Not on file  Occupational History   Not on file  Tobacco Use   Smoking status: Former    Types: Cigarettes    Passive exposure: Past   Smokeless tobacco: Never  Vaping Use   Vaping Use: Never used  Substance and Sexual Activity   Alcohol use: Not Currently   Drug use: Never   Sexual activity: Not on file  Other Topics Concern   Not on file  Social History Narrative   Not on file   Social Determinants of Health   Financial Resource Strain: Not on file  Food Insecurity: Not on file  Transportation Needs: Not on file  Physical Activity: Not on file  Stress: Not on file  Social Connections: Not on file  Family History: The patient's family history includes Cancer in his father; Heart Problems in his brother and mother; Parkinson's disease in his brother; Stroke in his mother. ROS:   Please see the history of present illness.    All other systems reviewed and are negative.  EKGs/Labs/Other Studies Reviewed:    The following studies were reviewed today:  EKG:  EKG ordered today and personally reviewed.  The ekg ordered today demonstrates dual-chamber paced rhythm with atrial sensed ventricular paced no atrial fibrillation  Recent Labs: No results found for requested labs within last 365 days.  Recent Lipid Panel    Component Value Date/Time   CHOL 171 04/28/2020 1043   TRIG 103 04/28/2020 1043   HDL 53 04/28/2020 1043   CHOLHDL 3.2 04/28/2020 1043   LDLCALC 99 04/28/2020 1043    Physical Exam:    VS:  BP (!) 162/70 (BP Location: Right Arm, Patient Position: Sitting)    Pulse 75   Ht 5\' 7"  (1.702 m)   Wt 141 lb (64 kg)   SpO2 98%   BMI 22.08 kg/m     Wt Readings from Last 3 Encounters:  12/26/21 141 lb (64 kg)  11/14/21 139 lb (63 kg)  05/02/21 136 lb (61.7 kg)     GEN: He looks young for his age well nourished, well developed in no acute distress HEENT: Normal NECK: No JVD; No carotid bruits LYMPHATICS: No lymphadenopathy CARDIAC: RRR, no murmurs, rubs, gallops RESPIRATORY:  Clear to auscultation without rales, wheezing or rhonchi  ABDOMEN: Soft, non-tender, non-distended MUSCULOSKELETAL:  No edema; No deformity  SKIN: Warm and dry NEUROLOGIC:  Alert and oriented x 3 PSYCHIATRIC:  Normal affect    Signed, 05/04/21, MD  12/26/2021 2:00 PM    [facility]

## 2021-12-26 ENCOUNTER — Encounter: Payer: Self-pay | Admitting: Cardiology

## 2021-12-26 ENCOUNTER — Ambulatory Visit: Payer: Medicare HMO | Attending: Cardiology | Admitting: Cardiology

## 2021-12-26 VITALS — BP 162/70 | HR 75 | Ht 67.0 in | Wt 141.0 lb

## 2021-12-26 DIAGNOSIS — Z950 Presence of cardiac pacemaker: Secondary | ICD-10-CM

## 2021-12-26 DIAGNOSIS — N1832 Chronic kidney disease, stage 3b: Secondary | ICD-10-CM

## 2021-12-26 DIAGNOSIS — Z7901 Long term (current) use of anticoagulants: Secondary | ICD-10-CM | POA: Diagnosis not present

## 2021-12-26 DIAGNOSIS — I255 Ischemic cardiomyopathy: Secondary | ICD-10-CM

## 2021-12-26 DIAGNOSIS — E782 Mixed hyperlipidemia: Secondary | ICD-10-CM

## 2021-12-26 DIAGNOSIS — I480 Paroxysmal atrial fibrillation: Secondary | ICD-10-CM | POA: Diagnosis not present

## 2021-12-26 DIAGNOSIS — I10 Essential (primary) hypertension: Secondary | ICD-10-CM

## 2021-12-26 DIAGNOSIS — I4892 Unspecified atrial flutter: Secondary | ICD-10-CM | POA: Diagnosis not present

## 2021-12-26 DIAGNOSIS — I951 Orthostatic hypotension: Secondary | ICD-10-CM

## 2021-12-26 DIAGNOSIS — I25119 Atherosclerotic heart disease of native coronary artery with unspecified angina pectoris: Secondary | ICD-10-CM | POA: Diagnosis not present

## 2021-12-26 DIAGNOSIS — I442 Atrioventricular block, complete: Secondary | ICD-10-CM

## 2021-12-26 MED ORDER — ELIQUIS 2.5 MG PO TABS: 2.5000 mg | ORAL_TABLET | Freq: Two times a day (BID) | ORAL | 12 refills | Status: DC

## 2021-12-26 MED ORDER — FUROSEMIDE 20 MG PO TABS: ORAL_TABLET | ORAL | 3 refills | Status: DC

## 2021-12-26 NOTE — Patient Instructions (Signed)
Medication Instructions:  Your physician has recommended you make the following change in your medication:   Start 20 mg Furosemide on Monday, Wednesday and Friday. Take any day your weight is greater than 138 lbs.  *If you need a refill on your cardiac medications before your next appointment, please call your pharmacy*   Lab Work: None ordered If you have labs (blood work) drawn today and your tests are completely normal, you will receive your results only by: MyChart Message (if you have MyChart) OR A paper copy in the mail If you have any lab test that is abnormal or we need to change your treatment, we will call you to review the results.   Testing/Procedures: None ordered   Follow-Up: At [facility], you and your health needs are our priority.  As part of our continuing mission to provide you with exceptional heart care, we have created designated Provider Care Teams.  These Care Teams include your primary Cardiologist (physician) and Advanced Practice Providers (APPs -  Physician Assistants and Nurse Practitioners) who all work together to provide you with the care you need, when you need it.  We recommend signing up for the patient portal called "MyChart".  Sign up information is provided on this After Visit Summary.  MyChart is used to connect with patients for Virtual Visits (Telemedicine).  Patients are able to view lab/test results, encounter notes, upcoming appointments, etc.  Non-urgent messages can be sent to your provider as well.   To learn more about what you can do with MyChart, go to [email or web address].    Your next appointment:   3 month(s)  The format for your next appointment:   In Person  Provider:   [name], MD   Other Instructions NA

## 2022-01-16 ENCOUNTER — Telehealth: Payer: Self-pay | Admitting: Cardiology

## 2022-01-16 NOTE — Telephone Encounter (Signed)
Pt c/o medication issue:  1. Name of Medication:   furosemide (LASIX) 20 MG tablet   2. How are you currently taking this medication (dosage and times per day)?  Instructions changed at hospital  3. Are you having a reaction (difficulty breathing--STAT)?  No  4. What is your medication issue?    Caller stated the patient was discharged with instructions to take the Furoseminde daily as needed which caller states is different from instructions which were originally prescribed.  Caller stated patient's family would like to get him a Life Alert necklace and want to know if this will affect his pacemaker.  Caller stated patient was also prescribed Duoneb for his nebulizer and patient's family is concerned that this medication could cause the patient to go into Afib.

## 2022-01-17 ENCOUNTER — Other Ambulatory Visit: Payer: Self-pay

## 2022-01-17 NOTE — Telephone Encounter (Signed)
Called patient's daughter and informed her that Dr. Bettina Gavia recommended that the patient take his Lasix the way it was prescribed before he went into the hospital. She was also informed that the Life Alert necklace will not interfere with his pacemaker and the Duonebs for his nebulizer should not cause A-fib per Dr. Bettina Gavia. I also informed the patient's daughter that Dr. Bettina Gavia recommends that the patient see his PCP instead of seeing Dr. Bettina Gavia. Patients daughter agreed and was very appreciative for the answers to the home health nurses questions and she had no further questions at this time.

## 2022-01-17 NOTE — Progress Notes (Deleted)
Cardiology Office Note:    Date:  01/17/2022   ID:  Greg [facility], DOB 03-Jun-1925, MRN [id number]  PCP:  Raina Mina., MD  Cardiologist:  [name], MD    Referring MD: Raina Mina., MD    ASSESSMENT:    No diagnosis found. PLAN:    In order of problems listed above:  ***   Next appointment: ***   Medication Adjustments/Labs and Tests Ordered: Current medicines are reviewed at length with the patient today.  Concerns regarding medicines are outlined above.  No orders of the defined types were placed in this encounter.  No orders of the defined types were placed in this encounter.   No chief complaint on file.   History of Present Illness:    Greg O[facility]nor is a 89 y.o. male with a hx of  CAD with remote CABG in 2005 hypertensive heart disease with symptomatic orthostatic hypotension hyperlipidemia statin intolerance bradycardia with heart block and permanent pacemaker followed in our device clinic  last seen 12/26/2021 following [facility] admission.He was admitted to [facility] underwent inpatient cardioversion 12/06/2021 to sinus rhythm with symptomatic atrial flutter echocardiogram showed EF of 25 to 30%. He was maintained on low-dose amiodarone. Compliance with diet, lifestyle and medications: *** Past Medical History:  Diagnosis Date   Anemia, chronic disease 05/12/2016   Anticoagulant long-term use 07/19/2015   Anticoagulated 06/17/2014   Atrioventricular block, complete ([place]) 12/31/2014   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 07/19/2015   BPH (benign prostatic hyperplasia) 07/20/2015   CAD, multiple vessel 07/20/2015   Cardiomyopathy ([place]) 07/20/2015   Chronic diarrhea 11/20/2018   Coronary artery disease involving native coronary artery of native heart with angina pectoris ([place]) 07/20/2015   Diabetes mellitus with stage 3 chronic kidney disease ([place]) 07/20/2015   Essential hypertension 07/20/2015   GERD without  esophagitis 07/20/2015   Hepatitis A    History of pulmonary embolism 07/20/2015   Hypertensive heart disease without heart failure 06/17/2014   Mixed hyperlipidemia 07/20/2015   Old myocardial infarction 07/20/2015   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 OLD ATRIAL LEAD failure   Paroxysmal atrial fibrillation ([place]) 05/31/2015   Pyuria 06/06/2019   Stage 3 chronic kidney disease ([place]) 07/20/2015   Statin intolerance 10/13/2018   Subconjunctival hemorrhage 07/20/2015   Type 2 diabetes mellitus without complication, without long-term current use of insulin ([place]) 07/20/2015   Vitamin B12 deficiency 07/20/2015    Past Surgical History:  Procedure Laterality Date   APPENDECTOMY     CHOLECYSTECTOMY     CORONARY ARTERY BYPASS GRAFT     CYSTOSCOPY     INSERT / REPLACE / [place] N/A 10/25/2018   Procedure: PPM GENERATOR CHANGEOUT;  Surgeon: Constance Haw, MD;  Location: [facility];  Service: Cardiovascular;  Laterality: N/A;   PROSTATECTOMY      Current Medications: No outpatient medications have been marked as taking for the 01/18/22 encounter (Appointment) with Richardo Priest, MD.     Allergies:   Ezetimibe; Fenofibrate micronized; Fish oil; Niacin; Promethazine; Simvastatin; and Tetanus toxoid, adsorbed   Social History   Socioeconomic History   Marital status: Married    Spouse name: Not on file   Number of children: Not  on file   Years of education: Not on file   Highest education level: Not on file  Occupational History   Not on file  Tobacco Use   Smoking status: Former    Types: Cigarettes    Passive exposure: Past   Smokeless tobacco: Never  Vaping Use   Vaping Use: Never used  Substance and Sexual Activity   Alcohol use: Not  Currently   Drug use: Never   Sexual activity: Not on file  Other Topics Concern   Not on file  Social History Narrative   Not on file   Social Determinants of Health   Financial Resource Strain: Not on file  Food Insecurity: Not on file  Transportation Needs: Not on file  Physical Activity: Not on file  Stress: Not on file  Social Connections: Not on file     Family History: The patient's ***family history includes Cancer in his father; Heart Problems in his brother and mother; Parkinson's disease in his brother; Stroke in his mother. ROS:   Please see the history of present illness.    All other systems reviewed and are negative.  EKGs/Labs/Other Studies Reviewed:    The following studies were reviewed today:  EKG:  EKG ordered today and personally reviewed.  The ekg ordered today demonstrates ***  Recent Labs: No results found for requested labs within last 365 days.  Recent Lipid Panel    Component Value Date/Time   CHOL 171 04/28/2020 1043   TRIG 103 04/28/2020 1043   HDL 53 04/28/2020 1043   CHOLHDL 3.2 04/28/2020 1043   LDLCALC 99 04/28/2020 1043    Physical Exam:    VS:  There were no vitals taken for this visit.    Wt Readings from Last 3 Encounters:  12/26/21 141 lb (64 kg)  11/14/21 139 lb (63 kg)  05/02/21 136 lb (61.7 kg)     GEN: *** Well nourished, well developed in no acute distress HEENT: Normal NECK: No JVD; No carotid bruits LYMPHATICS: No lymphadenopathy CARDIAC: ***RRR, no murmurs, rubs, gallops RESPIRATORY:  Clear to auscultation without rales, wheezing or rhonchi  ABDOMEN: Soft, non-tender, non-distended MUSCULOSKELETAL:  No edema; No deformity  SKIN: Warm and dry NEUROLOGIC:  Alert and oriented x 3 PSYCHIATRIC:  Normal affect    Signed, [name], MD  01/17/2022 3:15 PM    [facility]

## 2022-01-17 NOTE — Telephone Encounter (Signed)
Left message for Greg Oconnor from [facility] to call back.

## 2022-01-18 ENCOUNTER — Ambulatory Visit: Payer: Medicare HMO | Admitting: Cardiology

## 2022-01-18 DIAGNOSIS — I5041 Acute combined systolic (congestive) and diastolic (congestive) heart failure: Secondary | ICD-10-CM | POA: Insufficient documentation

## 2022-01-18 HISTORY — DX: Acute combined systolic (congestive) and diastolic (congestive) heart failure: I50.41

## 2022-01-20 ENCOUNTER — Ambulatory Visit (INDEPENDENT_AMBULATORY_CARE_PROVIDER_SITE_OTHER): Payer: Medicare HMO

## 2022-01-20 DIAGNOSIS — I442 Atrioventricular block, complete: Secondary | ICD-10-CM

## 2022-01-20 LAB — CUP PACEART REMOTE DEVICE CHECK
Battery Remaining Longevity: 85 mo
Battery Remaining Percentage: 70 %
Battery Voltage: 3.01 V
Brady Statistic AS VP Percent: 99 %
Brady Statistic AS VS Percent: 1 %
Brady Statistic RV Percent Paced: 99 %
Date Time Interrogation Session: 20240112020016
Implantable Lead Connection Status: 753985
Implantable Lead Connection Status: 753985
Implantable Lead Implant Date: 19980122
Implantable Lead Implant Date: 20040315
Implantable Lead Location: 753859
Implantable Lead Location: 753860
Implantable Pulse Generator Implant Date: 20201016
Lead Channel Impedance Value: 380 Ohm
Lead Channel Impedance Value: 580 Ohm
Lead Channel Pacing Threshold Amplitude: 0.5 V
Lead Channel Pacing Threshold Pulse Width: 0.5 ms
Lead Channel Sensing Intrinsic Amplitude: 1.8 mV
Lead Channel Sensing Intrinsic Amplitude: 11 mV
Lead Channel Setting Pacing Amplitude: 2.5 V
Lead Channel Setting Pacing Pulse Width: 0.5 ms
Lead Channel Setting Sensing Sensitivity: 4 mV
Pulse Gen Model: 2272
Pulse Gen Serial Number: 9168465

## 2022-01-24 NOTE — Progress Notes (Signed)
Cardiology Office Note:    Date:  01/25/2022   ID:  Greg Oconnor, DOB 1925/07/10, MRN [id number]  PCP:  Greg Payment., MD  Cardiologist:  Greg Herrlich, MD    Referring MD: Greg Payment., MD    ASSESSMENT:    1. Hypertensive heart disease with chronic systolic congestive heart failure (HCC)   2. Ischemic cardiomyopathy   3. Coronary artery disease involving native coronary artery of native heart with angina pectoris (HCC)   4. Paroxysmal atrial fibrillation (HCC)   5. On amiodarone therapy   6. Anticoagulant long-term use   7. Complete AV block (HCC)   8. Pacemaker   9. Stage 3b chronic kidney disease (HCC)   10. Mixed hyperlipidemia    PLAN:    In order of problems listed above:  Unfortunately as a consequence of hospitalization his heart failure is worse and he is markedly fluid overloaded not responding to his usual diuretic it was transition furosemide to torsemide 1 week check renal function continue to weigh daily I told him my expectation is weight will fall 5 to 8 pounds and his dry weight will decrease from what we use previously at 138 and likely closer to 1 30-1 32. Blood pressure should improve with diuresis Stable CAD atrial fibrillation continue anticoagulation and amiodarone   Next appointment: 6 weeks   Medication Adjustments/Labs and Tests Ordered: Current medicines are reviewed at length with the patient today.  Concerns regarding medicines are outlined above.  No orders of the defined types were placed in this encounter.  No orders of the defined types were placed in this encounter.   No chief complaint on file.   History of Present Illness:    Greg Oconnor is a 89 y.o. male with a hx of CAD with remote CABG in 2005 hypertensive heart disease with symptomatic orthostatic hypotension hyperlipidemia statin intolerance bradycardia with heart block and permanent pacemaker followed in our device clinic.  He was last seen 12/26/2021 after  [facility] admission with atrial flutter and underwent inpatient cardioversion to sinus rhythm with a new finding of edema reduced ejection fraction of 25 to 30% and was maintained on low-dose amiodarone.Marland Kitchen  His was recently admitted to [facility] with pneumonia received IV antibiotics diuretics were held developed heart failure and [facility] discharge from the [facility] without diuretic therapy.  If all my office had placed him back on his usual diuretic and he was seen by his PCP 01/18/2022 his BNP level was significantly elevated 1016.  Abnormal BMP with creatinine 1.75 potassium 4.8 GFR 36 cc/min hemoglobin 11.3.  Compliance with diet, lifestyle and medications: Yes  Daughters are present they are very involved in his care. I gave them a very strong message and should not.  His amiodarone Since discharge from the [facility] he is quite edematous he is more short of breath and he is having orthopnea.  Normal Takes his furosemide daily I will switch him to torsemide a more dependable diuretic and higher dose and see him back in the office and recheck his renal function in 1 week ED has not had PND and he still able to function Unfortunately as a consequence of hospitalization with IV fluids and holding his diuretic Also has had a significant progression of his heart disease with his ejection fraction heart failure status   Past Medical History:  Diagnosis Date   Anemia, chronic disease 05/12/2016   Anticoagulant long-term use 07/19/2015   Anticoagulated 06/17/2014   Atrioventricular block, complete (  Greg [facility]) 12/31/2014   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 07/19/2015   BPH (benign prostatic hyperplasia) 07/20/2015   CAD, multiple vessel 07/20/2015   Cardiomyopathy (Greg [facility]) 07/20/2015   Chronic diarrhea 11/20/2018   Coronary artery disease involving native coronary artery of native heart with angina pectoris (Greg Oconnor) 07/20/2015   Diabetes mellitus with stage 3 chronic  kidney disease (Greg Oconnor) 07/20/2015   Essential hypertension 07/20/2015   GERD without esophagitis 07/20/2015   Hepatitis A    History of pulmonary embolism 07/20/2015   Hypertensive heart disease without heart failure 06/17/2014   Mixed hyperlipidemia 07/20/2015   Old myocardial infarction 07/20/2015   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 OLD ATRIAL LEAD failure   Paroxysmal atrial fibrillation (Greg Oconnor) 05/31/2015   Pyuria 06/06/2019   Stage 3 chronic kidney disease (Greg Ocon[facility]) 07/20/2015   Statin intolerance 10/13/2018   Subconjunctival hemorrhage 07/20/2015   Type 2 diabetes mellitus without complication, without long-term current use of insulin (Greg Oconnor) 07/20/2015   Vitamin B12 deficiency 07/20/2015    Past Surgical History:  Procedure Laterality Date   APPENDECTOMY     CHOLECYSTECTOMY     CORONARY ARTERY BYPASS GRAFT     CYSTOSCOPY     INSERT / REPLACE / [place] N/A 10/25/2018   Procedure: PPM GENERATOR CHANGEOUT;  Surgeon: Greg Haw, MD;  Location: [facility];  Service: Cardiovascular;  Laterality: N/A;   PROSTATECTOMY      Current Medications: Current Meds  Medication Sig   acetaminophen (TYLENOL) 500 MG tablet Take 500 mg by mouth every 6 (Greg) hours as needed for moderate pain or headache.   albuterol (VENTOLIN HFA) 108 (90 Base) MCG/ACT inhaler Inhale 2 puffs into the lungs every 4 (four) hours as needed for wheezing or shortness of breath.   amiodarone (PACERONE) 200 MG tablet Take 1 tablet (200 mg total) by mouth daily.   cetirizine (ZYRTEC) 10 MG tablet Take 10 mg by mouth daily as needed for allergies.    cholestyramine (QUESTRAN) 4 GM/DOSE powder Take 4 g by mouth daily as needed (loose stools).    ELIQUIS 2.5 MG TABS tablet Take 1 tablet (2.5 mg  total) by mouth 2 (two) times daily.   folic acid (FOLVITE) 627 MCG tablet Take 800 mcg by mouth daily.   furosemide (LASIX) 20 MG tablet Take 20 mg ( 1 tablet) on Monday, Wednesday and Friday. Take 1 on any day for weight greater than 138 pounds.   guaiFENesin (MUCINEX PO) Take 400 mg by mouth every 4 (four) hours as needed (congestion).   ipratropium-albuterol (DUONEB) 0.5-2.5 (3) MG/3ML SOLN Inhale 3 mLs into the lungs every 6 (Greg) hours as needed (Wheezing or Shortness of breath).   lisinopril (ZESTRIL) 20 MG tablet Take 0.5-1 tablets (10-20 mg total) by mouth daily as needed (if systolic bp is 035 or greater).   nitroGLYCERIN (NITROSTAT) 0.4 MG SL tablet Place 1 tablet (0.4 mg total) under the tongue every 5 (five) minutes as needed for chest pain.   Simethicone 125 MG CAPS Take 125 mg by mouth daily as needed (gas).   sodium chloride (OCEAN) 0.65 % SOLN nasal spray Place 1 spray into both nostrils as needed for congestion.   trolamine salicylate (ASPERCREME) 10 % cream Apply 1  application topically as needed for muscle pain.   vitamin B-12 (CYANOCOBALAMIN) 500 MCG tablet Take 500 mcg by mouth daily.     Allergies:   Ezetimibe; Fenofibrate micronized; Fish oil; Niacin; Promethazine; Simvastatin; and Tetanus toxoid, adsorbed   Social History   Socioeconomic History   Marital status: Married    Spouse name: Not on file   Number of children: Not on file   Years of education: Not on file   Highest education level: Not on file  Occupational History   Not on file  Tobacco Use   Smoking status: Former    Types: Cigarettes    Passive exposure: Past   Smokeless tobacco: Never  Vaping Use   Vaping Use: Never used  Substance and Sexual Activity   Alcohol use: Not Currently   Drug use: Never   Sexual activity: Not on file  Other Topics Concern   Not on file  Social History Narrative   Not on file   Social Determinants of Health   Financial Resource Strain: Not on file  Food  Insecurity: Not on file  Transportation Needs: Not on file  Physical Activity: Not on file  Stress: Not on file  Social Connections: Not on file     Family History: The patient's family history includes Cancer in his father; Heart Problems in his brother and mother; Parkinson's disease in his brother; Stroke in his mother. ROS:   Please see the history of present illness.    All other systems reviewed and are negative.  EKGs/Labs/Other Studies Reviewed:    The following studies were reviewed today:  Recent Lipid Panel    Component Value Date/Time   CHOL 171 04/28/2020 1043   TRIG 103 04/28/2020 1043   HDL 53 04/28/2020 1043   CHOLHDL 3.2 04/28/2020 1043   LDLCALC 99 04/28/2020 1043    Physical Exam:    VS:  There were no vitals taken for this visit.    Wt Readings from Last 3 Encounters:  12/26/21 141 lb (64 kg)  11/14/21 139 lb (63 kg)  05/02/21 136 lb (61.7 kg)     GEN: He appears his age she does not look chronically ill and frail well nourished, well developed in no acute distress HEENT: Normal NECK: He has marked neck vein distention JVD; No carotid bruits LYMPHATICS: No lymphadenopathy CARDIAC: RRR, no murmurs, rubs, gallops RESPIRATORY:  Clear to auscultation without rales, wheezing or rhonchi  ABDOMEN: Soft, non-tender, non-distended MUSCULOSKELETAL: He has 4+ bilateral lower extremity pitting edema edema; No deformity  SKIN: Warm and dry NEUROLOGIC:  Alert and oriented x 3 PSYCHIATRIC:  Normal affect    Signed, [name], MD  01/25/2022 10:51 AM    [place]

## 2022-01-25 ENCOUNTER — Other Ambulatory Visit: Payer: Self-pay

## 2022-01-25 ENCOUNTER — Encounter: Payer: Self-pay | Admitting: Cardiology

## 2022-01-25 ENCOUNTER — Ambulatory Visit: Payer: Medicare HMO | Attending: Cardiology | Admitting: Cardiology

## 2022-01-25 DIAGNOSIS — I5022 Chronic systolic (congestive) heart failure: Secondary | ICD-10-CM

## 2022-01-25 DIAGNOSIS — I480 Paroxysmal atrial fibrillation: Secondary | ICD-10-CM | POA: Diagnosis not present

## 2022-01-25 DIAGNOSIS — Z950 Presence of cardiac pacemaker: Secondary | ICD-10-CM

## 2022-01-25 DIAGNOSIS — N1832 Chronic kidney disease, stage 3b: Secondary | ICD-10-CM

## 2022-01-25 DIAGNOSIS — I25119 Atherosclerotic heart disease of native coronary artery with unspecified angina pectoris: Secondary | ICD-10-CM

## 2022-01-25 DIAGNOSIS — Z7901 Long term (current) use of anticoagulants: Secondary | ICD-10-CM

## 2022-01-25 DIAGNOSIS — Z79899 Other long term (current) drug therapy: Secondary | ICD-10-CM

## 2022-01-25 DIAGNOSIS — I110 Hypertensive heart disease with heart failure: Secondary | ICD-10-CM

## 2022-01-25 DIAGNOSIS — I255 Ischemic cardiomyopathy: Secondary | ICD-10-CM

## 2022-01-25 DIAGNOSIS — E782 Mixed hyperlipidemia: Secondary | ICD-10-CM

## 2022-01-25 DIAGNOSIS — I442 Atrioventricular block, complete: Secondary | ICD-10-CM

## 2022-01-25 MED ORDER — TORSEMIDE 40 MG PO TABS: 40.0000 mg | ORAL_TABLET | Freq: Every day | ORAL | 3 refills | Status: DC

## 2022-01-25 NOTE — Patient Instructions (Signed)
Medication Instructions:  Your physician has recommended you make the following change in your medication:   START: Torsemide 40 mg daily STOP: Furosemide  *If you need a refill on your cardiac medications before your next appointment, please call your pharmacy*   Lab Work: Your physician recommends that you return for lab work in:   Labs in 1 week: BMP, Pro BNP  If you have labs (blood work) drawn today and your tests are completely normal, you will receive your results only by: [place] (if you have MyChart) OR A paper copy in the mail If you have any lab test that is abnormal or we need to change your treatment, we will call you to review the results.   Testing/Procedures: None   Follow-Up: At [facility], you and your health needs are our priority.  As part of our continuing mission to provide you with exceptional heart care, we have created designated Provider Care Teams.  These Care Teams include your primary Cardiologist (physician) and Advanced Practice Providers (APPs -  Physician Assistants and Nurse Practitioners) who all work together to provide you with the care you need, when you need it.  We recommend signing up for the patient portal called "MyChart".  Sign up information is provided on this After Visit Summary.  MyChart is used to connect with patients for Virtual Visits (Telemedicine).  Patients are able to view lab/test results, encounter notes, upcoming appointments, etc.  Non-urgent messages can be sent to your provider as well.   To learn more about what you can do with MyChart, go to [email or web address].    Your next appointment:   6 week(s)  Provider:   [name], MD    Other Instructions None

## 2022-01-30 ENCOUNTER — Telehealth: Payer: Self-pay | Admitting: Cardiology

## 2022-01-30 NOTE — Telephone Encounter (Signed)
Stacy from [facility] called and stated she needs someone to call her back about Greg [facility]. She stated he has had a 7lb weight loss since starting torsemide last Thursday. Please advise. Call back number is [phone]

## 2022-01-30 NOTE — Telephone Encounter (Signed)
Left message for Marzetta Board [facility] nurse, to call back

## 2022-01-31 ENCOUNTER — Other Ambulatory Visit: Payer: Self-pay

## 2022-01-31 NOTE — Telephone Encounter (Signed)
Called Stacy from [facility] and she reported that the patient had lost 7 lbs. Since starting Torsemide last week. After reviewing Dr. Joya Gaskins note from the patient's last visit, he expected the patient to lose 5 - 8 lbs. Also the patient is coming in tomorrow (Wednesday) for lab work. Marzetta Board reports that the patient doesn't have any edema and his lung sounds are clear. I informed Marzetta Board that we would just continue to monitor the patient since this outcome is what Dr. Bettina Gavia expected. I also called the patient's sister, Irena Reichmann and informed her of the same plan. She was agreeable with this plan and had no further questions at this time.

## 2022-02-01 ENCOUNTER — Other Ambulatory Visit: Payer: Self-pay | Admitting: Cardiology

## 2022-02-02 ENCOUNTER — Telehealth: Payer: Self-pay | Admitting: Cardiology

## 2022-02-02 LAB — PRO B NATRIURETIC PEPTIDE: NT-Pro BNP: 3717 pg/mL — ABNORMAL HIGH (ref 0–486)

## 2022-02-02 LAB — BASIC METABOLIC PANEL
BUN/Creatinine Ratio: 24 (ref 10–24)
BUN: 43 mg/dL — ABNORMAL HIGH (ref 10–36)
CO2: 27 mmol/L (ref 20–29)
Calcium: 9.1 mg/dL (ref 8.6–10.2)
Chloride: 103 mmol/L (ref 96–106)
Creatinine, Ser: 1.81 mg/dL — ABNORMAL HIGH (ref 0.76–1.27)
Glucose: 174 mg/dL — ABNORMAL HIGH (ref 70–99)
Potassium: 4.1 mmol/L (ref 3.5–5.2)
Sodium: 146 mmol/L — ABNORMAL HIGH (ref 134–144)
eGFR: 34 mL/min/{1.73_m2} — ABNORMAL LOW (ref >59)

## 2022-02-02 NOTE — Telephone Encounter (Signed)
Patient had experienced a 10lb loss. Patient's family member Juliann Pulse) says Dr. Bettina Gavia had estimated 6-8 lbs in his notes. Patient is weak, and unsteady. Juliann Pulse says she has not given him his fluid pill this morning and home health nurse is to come today but feels better if they had Dr. Joya Gaskins guidance.   Best number to reach daughter, Juliann Pulse is on her cell at [phone]

## 2022-02-02 NOTE — Telephone Encounter (Signed)
Recommendations reviewed with Juliann Pulse per DPR as per Dr. Wendy Poet note.  Juliann Pulse verbalized understanding and had no additional questions.

## 2022-02-03 ENCOUNTER — Telehealth: Payer: Self-pay | Admitting: Cardiology

## 2022-02-03 DIAGNOSIS — Z79899 Other long term (current) drug therapy: Secondary | ICD-10-CM

## 2022-02-03 NOTE — Telephone Encounter (Signed)
Called and spoke directly with patient who understands the need for repeat labs. Labs entered and scheduled for 02/17/22.

## 2022-02-03 NOTE — Telephone Encounter (Signed)
-----  Message from Richardo Priest, MD sent at 02/02/2022 12:07 PM EST ----- He has had a change in his kidney function but he requires a higher dose of diuretic  2 weeks recheck BMP and proBNP level

## 2022-02-07 NOTE — Progress Notes (Signed)
Remote pacemaker transmission.   

## 2022-02-08 ENCOUNTER — Telehealth: Payer: Self-pay

## 2022-02-08 ENCOUNTER — Other Ambulatory Visit: Payer: Self-pay

## 2022-02-08 MED ORDER — ELIQUIS 2.5 MG PO TABS: 2.5000 mg | ORAL_TABLET | Freq: Two times a day (BID) | ORAL | 3 refills | Status: DC

## 2022-02-08 NOTE — Telephone Encounter (Signed)
Received [place] patient assistance application for Eliquis. Informed patient and his daughter Alberteen Spindle that we needed patients out of pocket prescription expenses before application could be faxed. Fraser Din states that she will get this information from patients pharmacy and bring to office.

## 2022-02-08 NOTE — Telephone Encounter (Signed)
Eliquis Rx printed for MD to sign

## 2022-02-09 NOTE — Telephone Encounter (Signed)
[place] patient assistance application faxed with requested documents for Eliquis

## 2022-02-14 NOTE — Telephone Encounter (Signed)
Patient denied Eliquis assistance, copy of letter in chart media.

## 2022-02-16 NOTE — Telephone Encounter (Signed)
Left voicemail with daughter Juliann Pulse regarding patient being denied for Eliquis assistance

## 2022-02-18 LAB — BASIC METABOLIC PANEL
BUN/Creatinine Ratio: 24 (ref 10–24)
BUN: 43 mg/dL — ABNORMAL HIGH (ref 10–36)
CO2: 27 mmol/L (ref 20–29)
Calcium: 9.5 mg/dL (ref 8.6–10.2)
Chloride: 101 mmol/L (ref 96–106)
Creatinine, Ser: 1.78 mg/dL — ABNORMAL HIGH (ref 0.76–1.27)
Glucose: 117 mg/dL — ABNORMAL HIGH (ref 70–99)
Potassium: 4.5 mmol/L (ref 3.5–5.2)
Sodium: 142 mmol/L (ref 134–144)
eGFR: 35 mL/min/{1.73_m2} — ABNORMAL LOW (ref >59)

## 2022-02-19 LAB — PRO B NATRIURETIC PEPTIDE: NT-Pro BNP: 3844 pg/mL — ABNORMAL HIGH (ref 0–486)

## 2022-02-27 ENCOUNTER — Ambulatory Visit: Payer: Medicare HMO | Attending: Cardiology | Admitting: Cardiology

## 2022-02-27 ENCOUNTER — Encounter: Payer: Self-pay | Admitting: Cardiology

## 2022-02-27 VITALS — BP 128/64 | HR 73 | Ht 67.0 in | Wt 132.0 lb

## 2022-02-27 DIAGNOSIS — I480 Paroxysmal atrial fibrillation: Secondary | ICD-10-CM

## 2022-02-27 DIAGNOSIS — I442 Atrioventricular block, complete: Secondary | ICD-10-CM

## 2022-02-27 MED ORDER — MIDODRINE HCL 5 MG PO TABS: 5.0000 mg | ORAL_TABLET | Freq: Two times a day (BID) | ORAL | 6 refills | Status: DC

## 2022-02-27 NOTE — Progress Notes (Signed)
Electrophysiology Office Note   Date:  02/27/2022   ID:  Greg Oconnor, DOB Apr 11, 1925, MRN [id number]  PCP:  Raina Mina., MD  Cardiologist:  Bettina Gavia Primary Electrophysiologist:  Constance Haw, MD    No chief complaint on file.    History of Present Illness: Greg Oconnor is a 89 y.o. male who is being seen today for the evaluation of cardiomyopathy at the request of [name]. Presenting today for electrophysiology evaluation.    He has a history significant for coronary artery disease, hyperlipidemia, complete heart block.  He is post to dual-chamber pacemaker.  He was noted to be in atrial fibrillation and was started on amiodarone and had a cardioversion.  He had a recent hospitalization for pneumonia.  Since then home health has been coming to the house.  They have noted blood pressures that are significantly reduced throughout the day.  He feels dizzy and fatigued when blood pressures are low.  Today, denies symptoms of palpitations, chest pain, shortness of breath, orthopnea, PND, lower extremity edema, claudication, presyncope, syncope, bleeding, or neurologic sequela. The patient is tolerating medications without difficulties.       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 07/19/2015   BPH (benign prostatic hyperplasia) 07/20/2015   CAD, multiple vessel 07/20/2015   Cardiomyopathy ([place]) 07/20/2015   Chronic diarrhea 11/20/2018   Coronary artery disease involving native coronary artery of native heart with angina pectoris ([place]) 07/20/2015   Diabetes mellitus with stage 3 chronic kidney disease ([place]) 07/20/2015   Essential hypertension 07/20/2015   GERD without esophagitis 07/20/2015   Hepatitis A    History of pulmonary embolism 07/20/2015    Hypertensive heart disease without heart failure 06/17/2014   Mixed hyperlipidemia 07/20/2015   Old myocardial infarction 07/20/2015   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 OLD ATRIAL LEAD failure   Paroxysmal atrial fibrillation ([place]) 05/31/2015   Pyuria 06/06/2019   Stage 3 chronic kidney disease ([place]) 07/20/2015   Statin intolerance 10/13/2018   Subconjunctival hemorrhage 07/20/2015   Type 2 diabetes mellitus without complication, without long-term current use of insulin ([place]) 07/20/2015   Vitamin B12 deficiency 07/20/2015   Past Surgical History:  Procedure Laterality Date   APPENDECTOMY     CHOLECYSTECTOMY     CORONARY ARTERY BYPASS GRAFT     CYSTOSCOPY     INSERT / REPLACE / [place] N/A 10/25/2018   Procedure: PPM GENERATOR CHANGEOUT;  Surgeon: Constance Haw, MD;  Location: [facility];  Service: Cardiovascular;  Laterality: N/A;   PROSTATECTOMY       Current Outpatient Medications  Medication Sig Dispense Refill   acetaminophen (TYLENOL) 500 MG tablet Take 500 mg by mouth every 6 (six) hours as needed for moderate pain or headache.     amiodarone (PACERONE) 200 MG tablet Take 1 tablet (200 mg total) by mouth daily. 90 tablet 1   cetirizine (ZYRTEC) 10 MG tablet Take 10 mg by mouth daily as needed for allergies.      cholestyramine (QUESTRAN) 4 GM/DOSE powder Take 4 g  by mouth daily as needed (loose stools).      ELIQUIS 2.5 MG TABS tablet Take 1 tablet (2.5 mg total) by mouth 2 (two) times daily. 90 tablet 3   folic acid (FOLVITE) Q000111Q MCG tablet Take 800 mcg by mouth daily.     lisinopril (ZESTRIL) 20 MG tablet Take 0.5-1 tablets (10-20 mg total) by mouth daily as needed (if systolic bp is A999333 or greater). 90 tablet 3   midodrine (PROAMATINE)  5 MG tablet Take 1 tablet (5 mg total) by mouth 2 (two) times daily with a meal. 60 tablet 6   nitroGLYCERIN (NITROSTAT) 0.4 MG SL tablet Place 1 tablet (0.4 mg total) under the tongue every 5 (five) minutes as needed for chest pain. 25 tablet 11   Simethicone 125 MG CAPS Take 125 mg by mouth daily as needed (gas).     sodium chloride (OCEAN) 0.65 % SOLN nasal spray Place 1 spray into both nostrils as needed for congestion.     torsemide (DEMADEX) 20 MG tablet Take 20 mg by mouth daily.     trolamine salicylate (ASPERCREME) 10 % cream Apply 1 application topically as needed for muscle pain.     vitamin B-12 (CYANOCOBALAMIN) 500 MCG tablet Take 500 mcg by mouth daily.     No current facility-administered medications for this visit.    Allergies:   Ezetimibe; Fenofibrate; Fenofibrate micronized; Fish oil; Niacin; Promethazine; Simvastatin; and Tetanus toxoid, adsorbed   Social History:  The patient  reports that he has quit smoking. His smoking use included cigarettes. He has been exposed to tobacco smoke. He has never used smokeless tobacco. He reports that he does not currently use alcohol. He reports that he does not use drugs.   Family History:  The patient's family history includes Cancer in his father; Heart Problems in his brother and mother; Parkinson's disease in his brother; Stroke in his mother.   ROS:  Please see the history of present illness.   Otherwise, review of systems is positive for none.   All other systems are reviewed and negative.   PHYSICAL EXAM: VS:  BP 128/64   Pulse 73   Ht 5' 7"$  (1.702 m)   Wt 132 lb (59.9 kg)   SpO2 99%   BMI 20.67 kg/m  , BMI Body mass index is 20.67 kg/m. GEN: Well nourished, well developed, in no acute distress  HEENT: normal  Neck: no JVD, carotid bruits, or masses Cardiac: RRR; no murmurs, rubs, or gallops,no edema  Respiratory:  clear to auscultation bilaterally, normal work of breathing GI: soft, nontender, nondistended, + BS MS:  no deformity or atrophy  Skin: warm and dry, device site well healed Neuro:  Strength and sensation are intact Psych: euthymic mood, full affect  EKG:  EKG is ordered today. Personal review of the ekg ordered shows A sense, V pace  Personal review of the device interrogation today. Results in [place]   Personal review of the device interrogation today. Results in [place]: 02/17/2022: BUN 43; Creatinine, Ser 1.78; NT-Pro BNP 3,844; Potassium 4.5; Sodium 142    Lipid Panel     Component Value Date/Time   CHOL 171 04/28/2020 1043   TRIG 103 04/28/2020 1043   HDL 53 04/28/2020 1043   CHOLHDL 3.2 04/28/2020 1043   LDLCALC 99 04/28/2020 1043     Wt Readings from Last 3 Encounters:  02/27/22 132 lb (59.9 kg)  12/26/21 141 lb (64 kg)  11/14/21 139 lb (63  kg)      Other studies Reviewed: Additional studies/ records that were reviewed today include: TTE 04/24/2017 Review of the above records today demonstrates:  Ejection fraction 30%.  Left atrial enlargement.  No significant aortic stenosis.  Mild to moderate mitral regurgitation.   ASSESSMENT AND PLAN:  1.  Complete heart block: Status post [employer] Jude dual-chamber pacemaker with generator change 10/15/2018.  Device functioning appropriately.  Threshold, impedance, sensing stable.  RA has a chronically elevated threshold though rarely paces.  2.  Coronary artery disease: No current chest pain.  Plan per primary cardiology.  3.  Hypertension: Blood pressure is low and low at home.  Rosalena Mccorry start midodrine 5 mg twice daily.  This can be further followed by his primary cardiologist.  4.  Persistent atrial fibrillation: Currently on warfarin and amiodarone.  CHA2DS2-VASc of 4.  Sinus rhythm after ablation  5.  Second hypercoagulable state: Currently on warfarin for atrial fibrillation as above  6.  High risk medication monitoring: Currently on amiodarone.  Labs and EKG remained stable.   Current medicines are reviewed  at length with the patient today.   The patient does not have concerns regarding his medicines.  The following changes were made today: Start midodrine  Labs/ tests ordered today include:  Orders Placed This Encounter  Procedures   EKG 12-Lead     Disposition:   FU 6 months  Signed, Claiborne Stroble Meredith Leeds, MD  02/27/2022 12:24 PM     [facility] [address] [place]  [address] [phone] (office) [phone] (fax)

## 2022-02-27 NOTE — Patient Instructions (Signed)
Medication Instructions:  Your physician has recommended you make the following change in your medication:  START Midodrine 5 mg twice a day  *If you need a refill on your cardiac medications before your next appointment, please call your pharmacy*   Lab Work: None ordered   Testing/Procedures: None ordered   Follow-Up: At [facility], you and your health needs are our priority.  As part of our continuing mission to provide you with exceptional heart care, we have created designated Provider Care Teams.  These Care Teams include your primary Cardiologist (physician) and Advanced Practice Providers (APPs -  Physician Assistants and Nurse Practitioners) who all work together to provide you with the care you need, when you need it  Remote monitoring is used to monitor your Pacemaker or ICD from home. This monitoring reduces the number of office visits required to check your device to one time per year. It allows [place] to keep an eye on the functioning of your device to ensure it is working properly. You are scheduled for a device check from home on 04/21/2022. You may send your transmission at any time that day. If you have a wireless device, the transmission will be sent automatically. After your physician reviews your transmission, you will receive a postcard with your next transmission date.  Your next appointment:   6 month(s)  The format for your next appointment:   In Person  Provider:   Allegra Lai, MD    Thank you for choosing [facility]!!   Trinidad Curet, RN [phone]   Midodrine Tablets What is this medication? MIDODRINE (MI doe dreen) reduces the symptoms of low blood pressure caused by a changing position or standing. It works by narrowing your blood vessels, which helps increase your blood pressure. This reduces episodes of dizziness, lightheadedness, or fainting. This medicine may be used for other purposes; ask your health care provider or pharmacist if you  have questions. COMMON BRAND NAME(S): Orvaten, ProAmatine What should I tell my care team before I take this medication? They need to know if you have any of the following conditions: Difficulty passing urine Heart disease High blood pressure Kidney disease Overactive thyroid Pheochromocytoma An unusual or allergic reaction to midodrine, other medications, foods, dyes, or preservatives Pregnant or trying to get pregnant Breastfeeding How should I use this medication? Take this medication by mouth with water. Take it as directed on the prescription label at the same time every day. Do not take it after the evening meal, within 3 to 4 hours of bedtime, or while lying down. Keep taking it unless your care team tells you to stop. Talk to your care team about the use of this medication in children. Special care may be needed. Overdosage: If you think you have taken too much of this medicine contact a poison control center or emergency room at once. NOTE: This medicine is only for you. Do not share this medicine with others. What if I miss a dose? If you miss a dose, take it as soon as you can. If it is almost time for your next dose, take only that dose. Do not take double or extra doses. What may interact with this medication? Do not take this medication with any of the following: MAOIs, such as Marplan, Nardil, and Parnate Ergot alkaloids Medications for colds and breathing difficulties or weight loss Procarbazine This medication may also interact with the following: Cimetidine Digoxin Flecainide Fludrocortisone Medications called alpha blockers, such as doxazosin, prazosin, and terazosin  Metformin Procainamide Quinidine Ranitidine Triamterene This list may not describe all possible interactions. Give your health care provider a list of all the medicines, herbs, non-prescription drugs, or dietary supplements you use. Also tell them if you smoke, drink alcohol, or use illegal drugs.  Some items may interact with your medicine. What should I watch for while using this medication? Visit your care team for regular checks on your progress. Tell your care team if your symptoms do not start to get better or if they get worse. This medication may affect your coordination, reaction time, or judgment. Do not drive or operate machinery until you know how this medication affects you. Sit up or stand slowly to reduce the risk of dizzy or fainting spells. Drinking alcohol with this medication can increase the risk of these side effects. Your mouth may get dry. Chewing sugarless gum or sucking hard candy, and drinking plenty of water may help. Contact your care team if the problem does not go away or is severe. Do not treat yourself for coughs, colds, or pain while you are using this medication without asking your care team for advice. Some medications may increase your blood pressure. What side effects may I notice from receiving this medication? Side effects that you should report to your care team as soon as possible: Allergic reactions--skin rash, itching, hives, swelling of the face, lips, tongue, or throat Increase in blood pressure Slow heartbeat--dizziness, feeling faint or lightheaded, confusion, trouble breathing, unusual weakness or fatigue Trouble passing urine Side effects that usually do not require medical attention (report to your care team if they continue or are bothersome): Burning or tingling sensation in hands or feet Chills Increased need to urinate This list may not describe all possible side effects. Call your doctor for medical advice about side effects. You may report side effects to FDA at 1-800-FDA-1088. Where should I keep my medication? Keep out of the reach of children and pets. Store between 15 and 30 degrees C (59 and 86 degrees F). Get rid of any unused medication after the expiration date. To get rid of medications that are no longer needed or have  expired: Take the medication to a medication take-back program. Check with your pharmacy or law enforcement to find a location. If you cannot return the medication, check the label or package insert to see if the medication should be thrown out in the garbage or flushed down the toilet. If you are not sure, ask your care team. If it is safe to put it in the trash, empty the medication out of the container. Mix the medication with cat litter, dirt, coffee grounds, or other unwanted substance. Seal the mixture in a bag or container. Put it in the trash. NOTE: This sheet is a summary. It may not cover all possible information. If you have questions about this medicine, talk to your doctor, pharmacist, or health care provider.  2023 Elsevier/Gold Standard (2021-02-04 00:00:00)

## 2022-03-14 NOTE — Progress Notes (Unsigned)
Cardiology Office Note:    Date:  03/15/2022   ID:  Greg Oconnor, DOB 1925-09-04, MRN [id number]  PCP:  Raina Mina., MD  Cardiologist:  [name], MD    Referring MD: Raina Mina., MD    ASSESSMENT:    1. Hypertensive heart disease with chronic systolic congestive heart failure ([place])   2. Ischemic cardiomyopathy   3. Complete AV block ([facility])   4. Paroxysmal atrial fibrillation ([place])   5. On amiodarone therapy   6. Anticoagulant long-term use   7. Coronary artery disease involving native coronary artery of native heart with angina pectoris ([place])   8. Stage 3b chronic kidney disease ([place])   9. Mixed hyperlipidemia    PLAN:    In order of problems listed above:  Although his heart failure is well compensated continue to be bothered by symptomatic hypotension no longer is on ACE inhibitor we will increase the dose of his midodrine to 10 mg twice daily and I think we should screen him for cardiac amyloidosis echocardiogram in my office Continue amiodarone maintaining sinus rhythm on low-dose and his anticoagulant stable pacemaker function followed by device clinic Stable CAD on medical therapy Recent labs with worsened CKD Presently not on lipid-lowering therapy and age 71 I would not resume   Next appointment: 3 months     Medication Adjustments/Labs and Tests Ordered: Current medicines are reviewed at length with the patient today.  Concerns regarding medicines are outlined above.  No orders of the defined types were placed in this encounter.  No orders of the defined types were placed in this encounter.   Chief Complaint  Patient presents with   Follow-up   Congestive Heart Failure    History of Present Illness:    Greg Oconnor is a 89 y.o. male with a hx of CAD with remote CABG 2005 hypertensive heart disease ejection fraction 25 to 30% with symptomatic orthostatic hypotension hyperlipidemia statin intolerance bradycardia with heart block and  permanent pacemaker and atrial flutter maintaining sinus rhythm on amiodarone stage IIIb heart failure last seen 01/25/2022 with decompensated heart failure after [facility] admission with pneumonia IV antibiotics and interruption of his diuretic therapy.  Compliance with diet, lifestyle and medications: Yes  Recent labs [facility] PCP 02/28/2022: Sodium 142 potassium elevated 5.4 creatinine 2.60 heart 26 cc/min TSH 11.6 hemoglobin 12.5 platelets 194,000.  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 07/19/2015   BPH (benign prostatic hyperplasia) 07/20/2015   CAD, multiple vessel 07/20/2015   Cardiomyopathy ([place]) 07/20/2015   Chronic diarrhea 11/20/2018   Coronary artery disease involving native coronary artery of native heart with angina pectoris ([place]) 07/20/2015   Diabetes mellitus with stage 3 chronic kidney disease ([place]) 07/20/2015   Essential hypertension 07/20/2015   GERD without esophagitis 07/20/2015   Hepatitis A    History of pulmonary embolism 07/20/2015   Hypertensive heart disease without heart  failure 06/17/2014   Mixed hyperlipidemia 07/20/2015   Old myocardial infarction 07/20/2015   Organic impotence 07/20/2015   Orthostatic hypotension 07/20/2015   Pacemaker 06/17/2014   OLD ATRIAL LEAD failure   Pacemaker reprogramming/check 06/17/2014   Formatting of this note  might be different from the original. OLD ATRIAL LEAD failure   Paroxysmal atrial fibrillation ([place]) 05/31/2015   Pyuria 06/06/2019   Stage 3 chronic kidney disease ([place]) 07/20/2015   Statin intolerance 10/13/2018   Subconjunctival hemorrhage 07/20/2015   Type 2 diabetes mellitus without complication, without long-term current use of insulin ([place]) 07/20/2015   Vitamin B12 deficiency 07/20/2015    Past Surgical History:  Procedure Laterality Date   APPENDECTOMY     CHOLECYSTECTOMY     CORONARY ARTERY BYPASS GRAFT     CYSTOSCOPY     INSERT / REPLACE / [place] N/A 10/25/2018   Procedure: PPM GENERATOR CHANGEOUT;  Surgeon: Constance Haw, MD;  Location: [facility];  Service: Cardiovascular;  Laterality: N/A;   PROSTATECTOMY      Current Medications: Current Meds  Medication Sig   acetaminophen (TYLENOL) 500 MG tablet Take 500 mg by mouth every 6 (six) hours as needed for moderate pain or headache.   amiodarone (PACERONE) 200 MG tablet Take 1 tablet (200 mg total) by mouth daily.   cetirizine (ZYRTEC) 10 MG tablet Take 10 mg by mouth daily as needed for allergies.    cholestyramine (QUESTRAN) 4 GM/DOSE powder Take 4 g by mouth daily as needed (loose stools).    ELIQUIS 2.5 MG TABS tablet Take 1 tablet (2.5 mg total) by mouth 2 (two) times daily.   folic acid (FOLVITE) Q000111Q MCG tablet Take 800 mcg by mouth daily.   levothyroxine (SYNTHROID) 25 MCG tablet Take 25 mcg by mouth daily.   lisinopril (ZESTRIL) 20 MG tablet Take 0.5-1 tablets (10-20 mg total) by mouth daily as needed (if systolic bp is A999333 or greater).   midodrine (PROAMATINE) 5 MG tablet Take 1 tablet (5 mg total) by mouth 2 (two) times daily with a meal.   nitroGLYCERIN (NITROSTAT) 0.4 MG SL tablet Place 1 tablet (0.4 mg total) under the tongue every 5 (five) minutes as needed for chest pain.   Simethicone 125 MG CAPS Take 125 mg by mouth daily as needed (gas).   sodium  chloride (OCEAN) 0.65 % SOLN nasal spray Place 1 spray into both nostrils as needed for congestion.   torsemide (DEMADEX) 20 MG tablet Take 20 mg by mouth daily.   trolamine salicylate (ASPERCREME) 10 % cream Apply 1 application topically as needed for muscle pain.   vitamin B-12 (CYANOCOBALAMIN) 500 MCG tablet Take 500 mcg by mouth daily.     Allergies:   Ezetimibe; Fenofibrate; Fenofibrate micronized; Fish oil; Niacin; Promethazine; Simvastatin; and Tetanus toxoid, adsorbed   Social History   Socioeconomic History   Marital status: Married    Spouse name: Not on file   Number of children: Not on file   Years of education: Not on file   Highest education level: Not on file  Occupational History   Not on file  Tobacco Use   Smoking status: Former    Types: Cigarettes    Passive exposure: Past   Smokeless tobacco: Never  Vaping Use   Vaping Use: Never used  Substance and Sexual Activity   Alcohol use: Not Currently   Drug use: Never   Sexual activity:  Not on file  Other Topics Concern   Not on file  Social History Narrative   Not on file   Social Determinants of Health   Financial Resource Strain: Not on file  Food Insecurity: Not on file  Transportation Needs: Not on file  Physical Activity: Not on file  Stress: Not on file  Social Connections: Not on file     Family History: The patient's family history includes Cancer in his father; Heart Problems in his brother and mother; Parkinson's disease in his brother; Stroke in his mother. ROS:   Please see the history of present illness.    All other systems reviewed and are negative.  EKGs/Labs/Other Studies Reviewed:    The following studies were reviewed today:  Cardiac Studies & Procedures       ECHOCARDIOGRAM  ECHOCARDIOGRAM COMPLETE 08/21/2017              Recent Labs: 02/17/2022: BUN 43; Creatinine, Ser 1.78; NT-Pro BNP 3,844; Potassium 4.5; Sodium 142  Recent Lipid Panel    Component Value Date/Time    CHOL 171 04/28/2020 1043   TRIG 103 04/28/2020 1043   HDL 53 04/28/2020 1043   CHOLHDL 3.2 04/28/2020 1043   LDLCALC 99 04/28/2020 1043    Physical Exam:    VS:  BP (!) 140/70 (BP Location: Right Arm, Patient Position: Sitting, Cuff Size: Normal)   Pulse 70   Ht '5\' 7"'$  (1.702 m)   Wt 132 lb (59.9 kg)   SpO2 99%   BMI 20.67 kg/m     Wt Readings from Last 3 Encounters:  03/15/22 132 lb (59.9 kg)  02/27/22 132 lb (59.9 kg)  12/26/21 141 lb (64 kg)     GEN:  Well nourished, well developed in no acute distress HEENT: Normal NECK: No JVD; No carotid bruits LYMPHATICS: No lymphadenopathy CARDIAC: RRR, no murmurs, rubs, gallops RESPIRATORY:  Clear to auscultation without rales, wheezing or rhonchi  ABDOMEN: Soft, non-tender, non-distended MUSCULOSKELETAL:  No edema; No deformity  SKIN: Warm and dry NEUROLOGIC:  Alert and oriented x 3 PSYCHIATRIC:  Normal affect    Signed, [name], MD  03/15/2022 1:56 PM    [facility]

## 2022-03-15 ENCOUNTER — Ambulatory Visit: Payer: Medicare HMO | Attending: Cardiology | Admitting: Cardiology

## 2022-03-15 ENCOUNTER — Encounter: Payer: Self-pay | Admitting: Cardiology

## 2022-03-15 VITALS — BP 140/70 | HR 70 | Ht 67.0 in | Wt 132.0 lb

## 2022-03-15 DIAGNOSIS — I480 Paroxysmal atrial fibrillation: Secondary | ICD-10-CM

## 2022-03-15 DIAGNOSIS — I25119 Atherosclerotic heart disease of native coronary artery with unspecified angina pectoris: Secondary | ICD-10-CM

## 2022-03-15 DIAGNOSIS — I110 Hypertensive heart disease with heart failure: Secondary | ICD-10-CM

## 2022-03-15 DIAGNOSIS — I255 Ischemic cardiomyopathy: Secondary | ICD-10-CM | POA: Diagnosis not present

## 2022-03-15 DIAGNOSIS — Z7901 Long term (current) use of anticoagulants: Secondary | ICD-10-CM

## 2022-03-15 DIAGNOSIS — I5022 Chronic systolic (congestive) heart failure: Secondary | ICD-10-CM

## 2022-03-15 DIAGNOSIS — I442 Atrioventricular block, complete: Secondary | ICD-10-CM | POA: Diagnosis not present

## 2022-03-15 DIAGNOSIS — N1832 Chronic kidney disease, stage 3b: Secondary | ICD-10-CM

## 2022-03-15 DIAGNOSIS — E782 Mixed hyperlipidemia: Secondary | ICD-10-CM

## 2022-03-15 DIAGNOSIS — Z79899 Other long term (current) drug therapy: Secondary | ICD-10-CM

## 2022-03-15 MED ORDER — MIDODRINE HCL 10 MG PO TABS: 10.0000 mg | ORAL_TABLET | Freq: Two times a day (BID) | ORAL | 3 refills | Status: DC

## 2022-03-15 NOTE — Patient Instructions (Signed)
Medication Instructions:  Your physician has recommended you make the following change in your medication:   START: Midodrine 10 mg twice daily (Take this medication in the am and midday)  *If you need a refill on your cardiac medications before your next appointment, please call your pharmacy*   Lab Work: None If you have labs (blood work) drawn today and your tests are completely normal, you will receive your results only by: [place] (if you have MyChart) OR A paper copy in the mail If you have any lab test that is abnormal or we need to change your treatment, we will call you to review the results.   Testing/Procedures: Your physician has requested that you have an echocardiogram. Echocardiography is a painless test that uses sound waves to create images of your heart. It provides your doctor with information about the size and shape of your heart and how well your heart's chambers and valves are working. This procedure takes approximately one hour. There are no restrictions for this procedure. Please do NOT wear cologne, perfume, aftershave, or lotions (deodorant is allowed). Please arrive 15 minutes prior to your appointment time.    Follow-Up: At [facility], you and your health needs are our priority.  As part of our continuing mission to provide you with exceptional heart care, we have created designated Provider Care Teams.  These Care Teams include your primary Cardiologist (physician) and Advanced Practice Providers (APPs -  Physician Assistants and Nurse Practitioners) who all work together to provide you with the care you need, when you need it.  We recommend signing up for the patient portal called "MyChart".  Sign up information is provided on this After Visit Summary.  MyChart is used to connect with patients for Virtual Visits (Telemedicine).  Patients are able to view lab/test results, encounter notes, upcoming appointments, etc.  Non-urgent messages can  be sent to your provider as well.   To learn more about what you can do with MyChart, go to [email or web address].    Your next appointment:   3 month(s)  Provider:   [name], MD    Other Instructions None

## 2022-03-23 ENCOUNTER — Ambulatory Visit: Payer: Medicare HMO

## 2022-03-27 DIAGNOSIS — I5022 Chronic systolic (congestive) heart failure: Secondary | ICD-10-CM

## 2022-03-27 DIAGNOSIS — N179 Acute kidney failure, unspecified: Secondary | ICD-10-CM

## 2022-03-27 DIAGNOSIS — I480 Paroxysmal atrial fibrillation: Secondary | ICD-10-CM

## 2022-03-27 DIAGNOSIS — I10 Essential (primary) hypertension: Secondary | ICD-10-CM

## 2022-03-27 DIAGNOSIS — E785 Hyperlipidemia, unspecified: Secondary | ICD-10-CM

## 2022-03-27 DIAGNOSIS — I951 Orthostatic hypotension: Secondary | ICD-10-CM

## 2022-03-27 DIAGNOSIS — Z950 Presence of cardiac pacemaker: Secondary | ICD-10-CM

## 2022-03-28 DIAGNOSIS — E785 Hyperlipidemia, unspecified: Secondary | ICD-10-CM | POA: Diagnosis not present

## 2022-03-28 DIAGNOSIS — I951 Orthostatic hypotension: Secondary | ICD-10-CM | POA: Diagnosis not present

## 2022-03-28 DIAGNOSIS — N179 Acute kidney failure, unspecified: Secondary | ICD-10-CM | POA: Diagnosis not present

## 2022-03-28 DIAGNOSIS — I480 Paroxysmal atrial fibrillation: Secondary | ICD-10-CM | POA: Diagnosis not present

## 2022-03-29 DIAGNOSIS — E785 Hyperlipidemia, unspecified: Secondary | ICD-10-CM | POA: Diagnosis not present

## 2022-03-29 DIAGNOSIS — I951 Orthostatic hypotension: Secondary | ICD-10-CM | POA: Diagnosis not present

## 2022-03-29 DIAGNOSIS — I480 Paroxysmal atrial fibrillation: Secondary | ICD-10-CM | POA: Diagnosis not present

## 2022-03-29 DIAGNOSIS — N179 Acute kidney failure, unspecified: Secondary | ICD-10-CM | POA: Diagnosis not present

## 2022-04-11 ENCOUNTER — Ambulatory Visit: Payer: Medicare HMO | Admitting: Cardiology

## 2022-04-13 DIAGNOSIS — S2249XA Multiple fractures of ribs, unspecified side, initial encounter for closed fracture: Secondary | ICD-10-CM | POA: Diagnosis not present

## 2022-04-13 DIAGNOSIS — I509 Heart failure, unspecified: Secondary | ICD-10-CM | POA: Diagnosis not present

## 2022-04-13 DIAGNOSIS — I4891 Unspecified atrial fibrillation: Secondary | ICD-10-CM | POA: Diagnosis not present

## 2022-04-13 DIAGNOSIS — I340 Nonrheumatic mitral (valve) insufficiency: Secondary | ICD-10-CM | POA: Diagnosis not present

## 2022-04-13 DIAGNOSIS — I361 Nonrheumatic tricuspid (valve) insufficiency: Secondary | ICD-10-CM | POA: Diagnosis not present

## 2022-04-13 DIAGNOSIS — I951 Orthostatic hypotension: Secondary | ICD-10-CM | POA: Diagnosis not present

## 2022-04-13 DIAGNOSIS — Z950 Presence of cardiac pacemaker: Secondary | ICD-10-CM | POA: Diagnosis not present

## 2022-04-13 DIAGNOSIS — E785 Hyperlipidemia, unspecified: Secondary | ICD-10-CM

## 2022-04-14 DIAGNOSIS — S2249XA Multiple fractures of ribs, unspecified side, initial encounter for closed fracture: Secondary | ICD-10-CM | POA: Diagnosis not present

## 2022-04-14 DIAGNOSIS — I951 Orthostatic hypotension: Secondary | ICD-10-CM | POA: Diagnosis not present

## 2022-04-14 DIAGNOSIS — I509 Heart failure, unspecified: Secondary | ICD-10-CM | POA: Diagnosis not present

## 2022-04-14 DIAGNOSIS — I4891 Unspecified atrial fibrillation: Secondary | ICD-10-CM | POA: Diagnosis not present

## 2022-04-15 DIAGNOSIS — I509 Heart failure, unspecified: Secondary | ICD-10-CM | POA: Diagnosis not present

## 2022-04-15 DIAGNOSIS — I951 Orthostatic hypotension: Secondary | ICD-10-CM | POA: Diagnosis not present

## 2022-04-15 DIAGNOSIS — I4891 Unspecified atrial fibrillation: Secondary | ICD-10-CM | POA: Diagnosis not present

## 2022-04-15 DIAGNOSIS — S2249XA Multiple fractures of ribs, unspecified side, initial encounter for closed fracture: Secondary | ICD-10-CM | POA: Diagnosis not present

## 2022-04-16 DIAGNOSIS — I509 Heart failure, unspecified: Secondary | ICD-10-CM | POA: Diagnosis not present

## 2022-04-16 DIAGNOSIS — S2249XA Multiple fractures of ribs, unspecified side, initial encounter for closed fracture: Secondary | ICD-10-CM | POA: Diagnosis not present

## 2022-04-16 DIAGNOSIS — I951 Orthostatic hypotension: Secondary | ICD-10-CM | POA: Diagnosis not present

## 2022-04-16 DIAGNOSIS — I4891 Unspecified atrial fibrillation: Secondary | ICD-10-CM | POA: Diagnosis not present

## 2022-04-17 DIAGNOSIS — Z950 Presence of cardiac pacemaker: Secondary | ICD-10-CM | POA: Diagnosis not present

## 2022-04-17 DIAGNOSIS — I509 Heart failure, unspecified: Secondary | ICD-10-CM | POA: Diagnosis not present

## 2022-04-17 DIAGNOSIS — I4891 Unspecified atrial fibrillation: Secondary | ICD-10-CM | POA: Diagnosis not present

## 2022-04-18 DIAGNOSIS — I4891 Unspecified atrial fibrillation: Secondary | ICD-10-CM | POA: Diagnosis not present

## 2022-04-18 DIAGNOSIS — Z950 Presence of cardiac pacemaker: Secondary | ICD-10-CM | POA: Diagnosis not present

## 2022-04-18 DIAGNOSIS — I509 Heart failure, unspecified: Secondary | ICD-10-CM | POA: Diagnosis not present

## 2022-04-26 ENCOUNTER — Ambulatory Visit: Payer: Medicare HMO | Attending: Cardiology | Admitting: Cardiology

## 2022-04-26 ENCOUNTER — Other Ambulatory Visit: Payer: Medicare HMO

## 2022-04-26 ENCOUNTER — Encounter: Payer: Self-pay | Admitting: Cardiology

## 2022-04-26 VITALS — BP 108/52 | HR 55 | Ht 67.0 in | Wt 128.0 lb

## 2022-04-26 DIAGNOSIS — I951 Orthostatic hypotension: Secondary | ICD-10-CM

## 2022-04-26 DIAGNOSIS — I442 Atrioventricular block, complete: Secondary | ICD-10-CM | POA: Diagnosis not present

## 2022-04-26 DIAGNOSIS — I480 Paroxysmal atrial fibrillation: Secondary | ICD-10-CM | POA: Diagnosis not present

## 2022-04-26 DIAGNOSIS — I255 Ischemic cardiomyopathy: Secondary | ICD-10-CM | POA: Diagnosis not present

## 2022-04-26 DIAGNOSIS — Z950 Presence of cardiac pacemaker: Secondary | ICD-10-CM

## 2022-04-26 DIAGNOSIS — Z7901 Long term (current) use of anticoagulants: Secondary | ICD-10-CM

## 2022-04-26 DIAGNOSIS — I25119 Atherosclerotic heart disease of native coronary artery with unspecified angina pectoris: Secondary | ICD-10-CM

## 2022-04-26 DIAGNOSIS — N1832 Chronic kidney disease, stage 3b: Secondary | ICD-10-CM

## 2022-04-26 DIAGNOSIS — J189 Pneumonia, unspecified organism: Secondary | ICD-10-CM

## 2022-04-26 NOTE — Progress Notes (Signed)
Cardiology Office Note:    Date:  04/26/2022   ID:  Greg Oconnor, DOB 1925-08-30, MRN [id number]  PCP:  [name], MD   [facility] Providers Cardiologist:  None Electrophysiologist:  Will Jorja Loa, MD     Referring MD: [name], MD   CC: hospital follow up  History of Present Illness:    Greg Oconnor is a 89 y.o. male with a hx of complete heart block s/p Saint Jude PPM in 2020, HFrEF, hypertension, CAD s/p stenting and CABG in 2005, paroxysmal atrial fibrillation, GERD, DM 2, CKD stage III, chronic anemia, hyperlipidemia with statin intolerance.  Remote pacemaker check in January 2024 showed battery and lead in stable positioning.  Most recent echo in November 2023 showed an EF of 25 to 30%, moderate mitral regurgitation, mild aortic sclerosis without stenosis, right ventricular systolic pressure mildly elevated at 37 mmHg.  Was evaluated by EP most recently on 02/17/2022 for evaluation of cardiomyopathy.  He had recently been hospitalized for pneumonia, was back at home however feeling dizzy and fatigued when his blood pressures were on the low side.  Most recently evaluated by Dr. Dulce Sellar on 03/15/2022 at that time he was compensated from a heart failure perspective, but he was bothered by symptomatic hypotension.  His midodrine was increased to 10 mg twice daily.  He was maintaining sinus rhythm, lipids were mildly elevated however this was not pursued secondary to age.  Admitted to [facility] on 04/12/2022 to 04/18/2022 for acute renal failure.  He presented from SNF with complaints of dizziness, URI symptoms, proBNP was elevated at 13,300, negative for COVID.  He eventually diuresed greater than 12 L over his admission.  He also underwent a left-sided thoracentesis that yielded 300 mL.  Pacemaker was interrogated and appeared to be functioning appropriately.  His amiodarone was ultimately discontinued secondary to elevated LFTs.  Echo obtained on  04/14/2022 showed improvement in his EF 45 to 50%, mild to moderate MR, mild to moderate TR.  He presents today for hospital follow-up accompanied by 2 of his daughters.  He is currently requiring mobility with a wheelchair secondary to orthostasis.  Since his most recent discharge he has had " good days and bad days".  He had a near syncopal episode on 04/24/2022 when he was using the restroom trying to have a bowel movement, that same day he had a chest x-ray at the facility that revealed pneumonia and he was started on amoxicillin/clavulanate. Today, he is feeling slightly better. He has not been able to participate with PT d/t hypotension.  He is being weighed daily by staff at facility, reports weight was 128 today--was 152 on the day he was most recently admitted to the hospital.  He denies chest pain, palpitations, dyspnea, pnd, orthopnea, n, v, dizziness, syncope, edema, weight gain, or early satiety.   Past Medical History:  Diagnosis Date   Anemia, chronic disease 05/12/2016   Anticoagulant long-term use 07/19/2015   Anticoagulated 06/17/2014   Atrioventricular block, complete 12/31/2014   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 07/19/2015   BPH (benign prostatic hyperplasia) 07/20/2015   CAD, multiple vessel 07/20/2015   Cardiomyopathy 07/20/2015   Chronic diarrhea 11/20/2018   Coronary artery disease involving native coronary artery of native heart with angina pectoris 07/20/2015   Diabetes mellitus with stage 3 chronic kidney disease 07/20/2015   Essential hypertension 07/20/2015   GERD without esophagitis 07/20/2015   Hepatitis A    History  of pulmonary embolism 07/20/2015   Hypertensive heart disease without heart failure 06/17/2014   Mixed hyperlipidemia 07/20/2015   Old myocardial infarction 07/20/2015   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 OLD ATRIAL LEAD failure   Paroxysmal atrial fibrillation 05/31/2015   Pyuria 06/06/2019   Stage 3 chronic kidney disease 07/20/2015   Statin intolerance 10/13/2018   Subconjunctival hemorrhage 07/20/2015   Type 2 diabetes mellitus without complication, without long-term current use of insulin 07/20/2015   Vitamin B12 deficiency 07/20/2015    Past Surgical History:  Procedure Laterality Date   APPENDECTOMY     CHOLECYSTECTOMY     CORONARY ARTERY BYPASS GRAFT     CYSTOSCOPY     INSERT / REPLACE / REMOVE PACEMAKER     St. Jude   PPM GENERATOR CHANGEOUT N/A 10/25/2018   Procedure: PPM GENERATOR CHANGEOUT;  Surgeon: Regan Lemming, MD;  Location: [facility] INVASIVE CV LAB;  Service: Cardiovascular;  Laterality: N/A;   PROSTATECTOMY      Current Medications: Current Meds  Medication Sig   acetaminophen (TYLENOL) 325 MG tablet Take 2 tablets by mouth every 6 (six) hours as needed for moderate pain or headache.   albuterol (2.5 MG/3ML) 0.083% NEBU 3 mL, albuterol (5 MG/ML) 0.5% NEBU 0.5 mL Inhale into the lungs every 3 (three) hours as needed (shortness of breath / wheezing).   albuterol (VENTOLIN HFA) 108 (90 Base) MCG/ACT inhaler Inhale 2 puffs into the lungs every 4 (four) hours as needed for wheezing or shortness of breath.   carvedilol (COREG) 3.125 MG tablet Take 3.125 mg by mouth 2 (two) times daily with a meal.   dapagliflozin propanediol (FARXIGA) 5 MG TABS tablet Take 5 mg by mouth daily.   ELIQUIS 2.5 MG TABS tablet Take 1 tablet (2.5 mg total) by mouth 2 (two) times daily.   folic acid (FOLVITE) 1 MG tablet Take 1 tablet by mouth daily.   guaiFENesin (ROBITUSSIN) 100 MG/5ML liquid Take 5 mLs by mouth every 4 (four) hours as needed for cough or to loosen phlegm.   Insulin Regular Human (HUMULIN R IJ) Inject as directed. Sliding scale   ipratropium-albuterol (DUONEB) 0.5-2.5 (3) MG/3ML SOLN Take 3 mLs by nebulization. Every 6 hours for  congestion   levothyroxine (SYNTHROID) 50 MCG tablet Take 50 mcg by mouth daily.   midodrine (PROAMATINE) 10 MG tablet Take 1 tablet (10 mg total) by mouth 2 (two) times daily. Please take this medication in the AM and Midday   nitroGLYCERIN (NITROSTAT) 0.4 MG SL tablet Place 1 tablet (0.4 mg total) under the tongue every 5 (five) minutes as needed for chest pain.   polyethylene glycol powder (GLYCOLAX/MIRALAX) 17 GM/SCOOP powder Take 17 g by mouth every 12 (twelve) hours as needed for moderate constipation.   senna (SENOKOT) 8.6 MG tablet Take 2 each by mouth daily as needed (Constipation).   vitamin B-12 (CYANOCOBALAMIN) 500 MCG tablet Take 1,000 mcg by mouth daily.     Allergies:   Ezetimibe; Fenofibrate; Fenofibrate micronized; Fish oil; Niacin; Promethazine; Simvastatin; and Tetanus toxoid, adsorbed   Social History   Socioeconomic History   Marital status: Married    Spouse name: Not on file   Number of children: Not on file   Years of education: Not on file   Highest education level: Not on file  Occupational  History   Not on file  Tobacco Use   Smoking status: Former    Types: Cigarettes    Passive exposure: Past   Smokeless tobacco: Never  Vaping Use   Vaping Use: Never used  Substance and Sexual Activity   Alcohol use: Not Currently   Drug use: Never   Sexual activity: Not on file  Other Topics Concern   Not on file  Social History Narrative   Not on file   Social Determinants of Health   Financial Resource Strain: Not on file  Food Insecurity: Not on file  Transportation Needs: Not on file  Physical Activity: Not on file  Stress: Not on file  Social Connections: Not on file     Family History: The patient's family history includes Cancer in his father; Heart Problems in his brother and mother; Parkinson's disease in his brother; Stroke in his mother.  ROS:   Please see the history of present illness.     All other systems reviewed and are  negative.  EKGs/Labs/Other Studies Reviewed:    The following studies were reviewed today: Cardiac Studies & Procedures       ECHOCARDIOGRAM  ECHOCARDIOGRAM COMPLETE 08/21/2017              EKG:  EKG is  ordered today.  The ekg ordered today demonstrates atrial-sensed, v-paced, HR 55 bpm.   Recent Labs: 02/17/2022: BUN 43; Creatinine, Ser 1.78; NT-Pro BNP 3,844; Potassium 4.5; Sodium 142  Recent Lipid Panel    Component Value Date/Time   CHOL 171 04/28/2020 1043   TRIG 103 04/28/2020 1043   HDL 53 04/28/2020 1043   CHOLHDL 3.2 04/28/2020 1043   LDLCALC 99 04/28/2020 1043     Risk Assessment/Calculations:    CHA2DS2-VASc Score = 6   This indicates a 9.7% annual risk of stroke. The patient's score is based upon: CHF History: 1 HTN History: 1 Diabetes History: 1 Stroke History: 0 Vascular Disease History: 1 Age Score: 2 Gender Score: 0               Physical Exam:    VS:  BP (!) 108/52 (BP Location: Right Arm, Patient Position: Sitting, Cuff Size: Small)   Pulse (!) 55   Ht 5\' 7"  (1.702 m)   Wt 128 lb (58.1 kg)   SpO2 99%   BMI 20.05 kg/m     Wt Readings from Last 3 Encounters:  04/26/22 128 lb (58.1 kg)  03/15/22 132 lb (59.9 kg)  02/27/22 132 lb (59.9 kg)     GEN: Frail, well groomed, no acute distress HEENT: Normal NECK: No JVD; No carotid bruits LYMPHATICS: No lymphadenopathy CARDIAC: RRR, no murmurs, rubs, gallops RESPIRATORY:  rhonchi noted throughout, able to clear some with deep cough ABDOMEN: Soft, non-tender, non-distended MUSCULOSKELETAL:  No edema; No deformity  SKIN: Warm and dry NEUROLOGIC:  Alert and oriented x 3 PSYCHIATRIC:  Normal affect   ASSESSMENT:    1. Ischemic cardiomyopathy   2. Complete AV block   3. Paroxysmal atrial fibrillation   4. Anticoagulant long-term use   5. Coronary artery disease involving native coronary artery of native heart with angina pectoris   6. Stage 3b chronic kidney disease   7. Mixed  hyperlipidemia   8. Pacemaker   9. Orthostatic hypotension   10. Pneumonia due to infectious organism, unspecified laterality, unspecified part of lung    PLAN:    In order of problems listed above:  HFrEF/ICM- Echo obtained on 04/14/2022 showed  improvement in his EF 45 to 50%, mild to moderate MR, mild to moderate TR. NYHA class II-III--hard to determine as he is mostly having to remain seated d/t hypotension. Euvolemic.  Continue carvedilol 3.125 mg twice daily, currently on Farxiga 5 mg daily--not clear if this is for his heart failure versus diabetes, continue torsemide 20 mg as needed as needed for weight gain of 3 pounds in 1 day or 5 pounds over 1 week. He is unable to tolerate any further GDMT secondary to hypotension.  PAF/anticoagulation long-term use-no recurrence of atrial fibrillation, CHA2DS2-VASc score of 6, continue Eliquis 2.5 mg twice daily--currently meets reduced dose criteria.  Complete heart block/s/p PPM -most recent device check revealed normal parameters.  Community-acquired pneumonia-chest x-ray during hospitalization was not revealing for pneumonia.  Per reports from his daughter had a repeat chest x-ray on 04/24/2022 that was concerning for pneumonia.  Started on amoxicillin/clavulanate.   Orthostatic hypotension-currently not able to participate walking with PT at his facility secondary to hypotension.  In office today his blood pressure is 108/52.  Currently wearing knee-high TEDs, abdominal binder.  Recommend wearing thigh-high TEDs, abdominal binder.  He is currently on midodrine twice daily.  This has been somewhat persistent for him however may be exacerbated by recent pneumonia diagnosis, today he reports he feels relatively well, color is improved.  If he is better clinically from pneumonia and his blood pressure still remains low, need to consider stopping his Coreg.  CKD stage IIIb-most recent creatinine available for my review is 2.8, GFR 30.  Avoid nephrotoxic  agents, careful titration of diuretics.  Follow-up lab works following hospitalization have been obtained by SNF facility, per report from daughter's lab work was normal with the exception of his creatinine, repeat BMET had been obtained today and no results were available.  Will arrange for a follow-up BMET in 1 week.   Disposition-return in 6 weeks, repeat BMET at facility in 1 week.  Family to notify us if he continues to be profoundly hypotensive after he is treated for pneumonia, may need to discontinue his Coreg.           Medication Adjustments/Labs and Tests Ordered: Current medicines are reviewed at length with the patient today.  Concerns regarding medicines are outlined above.  Orders Placed This Encounter  Procedures   EKG 12-Lead   No orders of the defined types were placed in this encounter.   Patient Instructions  Medication Instructions:  Your physician recommends that you continue on your current medications as directed. Please refer to the Current Medication list given to you today.  *If you need a refill on your cardiac medications before your next appointment, please call your pharmacy*   Lab Work: None If you have labs (blood work) drawn today and your tests are completely normal, you will receive your results only by: MyChart Message (if you have MyChart) OR A paper copy in the mail If you have any lab test that is abnormal or we need to change your treatment, we will call you to review the results.   Testing/Procedures: None   Follow-Up: At [facility], you and your health needs are our priority.  As part of our continuing mission to provide you with exceptional heart care, we have created designated Provider Care Teams.  These Care Teams include your primary Cardiologist (physician) and Advanced Practice Providers (APPs -  Physician Assistants and Nurse Practitioners) who all work together to provide you with the care you need, when you  need  it.  We recommend signing up for the patient portal called "MyChart".  Sign up information is provided on this After Visit Summary.  MyChart is used to connect with patients for Virtual Visits (Telemedicine).  Patients are able to view lab/test results, encounter notes, upcoming appointments, etc.  Non-urgent messages can be sent to your provider as well.   To learn more about what you can do with MyChart, go to [email or web address].    Your next appointment:   6 weeks  Provider:   [name], MD    Other Instructions None    Signed, Flossie Dibble, NP  04/26/2022 2:50 PM    [facility]

## 2022-04-26 NOTE — Patient Instructions (Addendum)
Medication Instructions:  Your physician recommends that you continue on your current medications as directed. Please refer to the Current Medication list given to you today.  *If you need a refill on your cardiac medications before your next appointment, please call your pharmacy*   Lab Work: None If you have labs (blood work) drawn today and your tests are completely normal, you will receive your results only by: MyChart Message (if you have MyChart) OR A paper copy in the mail If you have any lab test that is abnormal or we need to change your treatment, we will call you to review the results.   Testing/Procedures: None   Follow-Up: At [facility], you and your health needs are our priority.  As part of our continuing mission to provide you with exceptional heart care, we have created designated Provider Care Teams.  These Care Teams include your primary Cardiologist (physician) and Advanced Practice Providers (APPs -  Physician Assistants and Nurse Practitioners) who all work together to provide you with the care you need, when you need it.  We recommend signing up for the patient portal called "MyChart".  Sign up information is provided on this After Visit Summary.  MyChart is used to connect with patients for Virtual Visits (Telemedicine).  Patients are able to view lab/test results, encounter notes, upcoming appointments, etc.  Non-urgent messages can be sent to your provider as well.   To learn more about what you can do with MyChart, go to [email or web address].    Your next appointment:   6 weeks  Provider:   [name], MD    Other Instructions None

## 2022-04-28 ENCOUNTER — Ambulatory Visit: Payer: Medicare HMO | Admitting: Cardiology

## 2022-05-08 ENCOUNTER — Ambulatory Visit (INDEPENDENT_AMBULATORY_CARE_PROVIDER_SITE_OTHER): Payer: Medicare HMO

## 2022-05-08 DIAGNOSIS — I442 Atrioventricular block, complete: Secondary | ICD-10-CM

## 2022-05-09 LAB — CUP PACEART REMOTE DEVICE CHECK
Battery Remaining Longevity: 84 mo
Battery Remaining Percentage: 67 %
Battery Voltage: 3.01 V
Brady Statistic AS VP Percent: 94 %
Brady Statistic AS VS Percent: 1 %
Brady Statistic RV Percent Paced: 99 %
Date Time Interrogation Session: 20240427144540
Implantable Lead Connection Status: 753985
Implantable Lead Connection Status: 753985
Implantable Lead Implant Date: 19980122
Implantable Lead Implant Date: 20040315
Implantable Lead Location: 753859
Implantable Lead Location: 753860
Implantable Pulse Generator Implant Date: 20201016
Lead Channel Impedance Value: 410 Ohm
Lead Channel Impedance Value: 600 Ohm
Lead Channel Pacing Threshold Amplitude: 0.75 V
Lead Channel Pacing Threshold Pulse Width: 0.5 ms
Lead Channel Sensing Intrinsic Amplitude: 1.4 mV
Lead Channel Sensing Intrinsic Amplitude: 11 mV
Lead Channel Setting Pacing Amplitude: 2.5 V
Lead Channel Setting Pacing Pulse Width: 0.5 ms
Lead Channel Setting Sensing Sensitivity: 4 mV
Pulse Gen Model: 2272
Pulse Gen Serial Number: 9168465

## 2022-06-06 NOTE — Progress Notes (Unsigned)
Cardiology Office Note:    Date:  06/07/2022   ID:  Greg Oconnor, DOB 21-Aug-1925, MRN [id number]  PCP:  [name], MD  Cardiologist:  [name], MD    Referring MD: [name], MD    ASSESSMENT:    1. Hypotension, unspecified hypotension type   2. Hypertensive heart disease with chronic systolic congestive heart failure (HCC)   3. Ischemic cardiomyopathy   4. Complete AV block (HCC)   5. Anticoagulant long-term use   6. Paroxysmal atrial fibrillation (HCC)   7. Coronary artery disease involving native coronary artery of native heart with angina pectoris (HCC)   8. Stage 3b chronic kidney disease (HCC)    PLAN:    In order of problems listed above:  Unfortunately remains symptomatic despite all her endeavors I think we need to stop his SGLT2 inhibitors and may play a role and continue midodrine support hose and abdominal binder avoid vasodilators beta-blockers diuretic and the family is struggling to come up with a safe environment and plan to keep him at home. Ejection fraction recently improved and he is off all of his guideline directed therapy no longer takes lisinopril Stable pacemaker function followed in our device clinic Continue his anticoagulant atrial fibrillation Stable CKD and CAD   Next appointment: 3.months    Medication Adjustments/Labs and Tests Ordered: Current medicines are reviewed at length with the patient today.  Concerns regarding medicines are outlined above.  No orders of the defined types were placed in this encounter.  No orders of the defined types were placed in this encounter.   Chief Complaint  Patient presents with   unstable gait    Dizziness   Near Syncope   Low BP    History of Present Illness:    Greg Oconnor is a 89 y.o. male with a hx of hypertensive heart disease with chronic systolic heart failure heart block with permanent pacemaker paroxysmal atrial fibrillation on amiodarone therapy long-term  anticoagulation coronary artery disease hyperlipidemia symptomatic hypotension and stage III CKD last seen 03/15/2022.  He had an echocardiogram performed [facility] 04/14/2022.  LV dysfunction was described as mild EF 45 to 50% with normal right ventricular size function.  There was mild to moderate mitral regurgitation seen.   There is a note from primary care physician 05/10/2022 that he was hospitalized with hypotension and syncope who is on midodrine and Florinef for blood pressure support.  Compliance with diet, lifestyle and medications: Yes  His 2 daughters are present and are staying at the home with him he continues to have episodes of weakness fall as well as syncope.  Orthostatic in nature Once or twice a week are admitting midodrine because of sitting blood pressure we decided not to hold the medication He is using support hose and abdominal binder but he is taking SGLT2 inhibitor that can cause sodium loss and looking to discontinue He is not having chest pain shortness of breath palpitation or syncope 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 07/19/2015   BPH (benign prostatic hyperplasia) 07/20/2015   CAD, multiple vessel 07/20/2015   Cardiomyopathy (HCC) 07/20/2015   Chronic diarrhea 11/20/2018   Coronary artery disease involving native coronary artery of native heart with angina pectoris (HCC) 07/20/2015   Diabetes mellitus with stage 3 chronic  kidney disease (HCC) 07/20/2015   Essential hypertension 07/20/2015   GERD without esophagitis 07/20/2015   Hepatitis A    History of pulmonary embolism 07/20/2015   Hypertensive heart disease without heart failure 06/17/2014   Mixed hyperlipidemia 07/20/2015   Old myocardial infarction 07/20/2015   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 OLD ATRIAL LEAD failure   Paroxysmal atrial fibrillation (HCC) 05/31/2015   Pyuria 06/06/2019   Stage 3 chronic kidney disease (HCC) 07/20/2015   Statin intolerance 10/13/2018   Subconjunctival hemorrhage 07/20/2015   Type 2 diabetes mellitus without complication, without long-term current use of insulin (HCC) 07/20/2015   Vitamin B12 deficiency 07/20/2015    Past Surgical History:  Procedure Laterality Date   APPENDECTOMY     CHOLECYSTECTOMY     CORONARY ARTERY BYPASS GRAFT     CYSTOSCOPY     INSERT / REPLACE / REMOVE PACEMAKER     [employer]   PPM GENERATOR CHANGEOUT N/A 10/25/2018   Procedure: PPM GENERATOR CHANGEOUT;  Surgeon: Regan Lemming, MD;  Location: [facility] INVASIVE CV LAB;  Service: Cardiovascular;  Laterality: N/A;   PROSTATECTOMY      Current Medications: Current Meds  Medication Sig   acetaminophen (TYLENOL) 325 MG tablet Take 2 tablets by mouth every 6 (six) hours as needed for moderate pain or headache.   cetirizine (ZYRTEC) 10 MG tablet Take 10 mg by mouth daily as needed for allergies.   cholestyramine (QUESTRAN) 4 GM/DOSE powder Take 4 g by mouth daily as needed (loose stools).   ELIQUIS 2.5 MG TABS tablet Take 1 tablet (2.5 mg total) by mouth 2 (two) times daily.   empagliflozin (JARDIANCE) 10 MG TABS tablet Take 10 mg by mouth daily.   folic acid (FOLVITE) 1 MG tablet Take 1 tablet by mouth daily.   levothyroxine (SYNTHROID) 75 MCG tablet Take 50 mcg by mouth daily.   lisinopril (ZESTRIL) 20 MG tablet Take 0.5-1 tablets (10-20 mg total) by mouth daily as needed (if systolic bp is 135 or greater).   midodrine (PROAMATINE) 10 MG tablet Take 1 tablet (10 mg total) by mouth 2 (two) times daily. Please take this medication in the AM and Midday   nitroGLYCERIN (NITROSTAT)  0.4 MG SL tablet Place 1 tablet (0.4 mg total) under the tongue every 5 (five) minutes as needed for chest pain.   Simethicone 125 MG CAPS Take 125 mg by mouth daily as needed (gas).   sodium chloride (OCEAN) 0.65 % SOLN nasal spray Place 1 spray into both nostrils as needed for congestion.   torsemide (DEMADEX) 10 MG tablet Take 20 mg by mouth daily as needed (edema).   trolamine salicylate (ASPERCREME) 10 % cream Apply 1 application  topically as needed for muscle pain.   vitamin B-12 (CYANOCOBALAMIN) 500 MCG tablet Take 1,000 mcg by mouth daily.   [DISCONTINUED] albuterol (2.5 MG/3ML) 0.083% NEBU 3 mL, albuterol (5 MG/ML) 0.5% NEBU 0.5 mL Inhale 2.5 mg into the lungs every 3 (three) hours as needed (shortness of breath / wheezing).   [DISCONTINUED] albuterol (VENTOLIN HFA) 108 (90 Base) MCG/ACT inhaler Inhale 2 puffs into the lungs every 4 (four) hours as needed for wheezing or shortness of breath.   [DISCONTINUED] amiodarone (PACERONE) 200 MG tablet Take 1 tablet (200 mg total) by mouth daily.   [DISCONTINUED] carvedilol (COREG) 3.125  MG tablet Take 3.125 mg by mouth 2 (two) times daily with a meal.   [DISCONTINUED] dapagliflozin propanediol (FARXIGA) 5 MG TABS tablet Take 5 mg by mouth daily.   [DISCONTINUED] guaiFENesin (ROBITUSSIN) 100 MG/5ML liquid Take 5 mLs by mouth every 4 (four) hours as needed for cough or to loosen phlegm.   [DISCONTINUED] Insulin Regular Human (HUMULIN R IJ) Inject as directed. Sliding scale   [DISCONTINUED] ipratropium-albuterol (DUONEB) 0.5-2.5 (3) MG/3ML SOLN Take 3 mLs by nebulization. Every 6 hours for congestion   [DISCONTINUED] polyethylene glycol powder (GLYCOLAX/MIRALAX) 17 GM/SCOOP powder Take 17 g by mouth every 12 (twelve) hours as needed for moderate constipation.   [DISCONTINUED] senna (SENOKOT) 8.6 MG tablet Take 2 each by mouth daily as needed (Constipation).     Allergies:   Ezetimibe; Fenofibrate; Fenofibrate micronized; Fish oil; Niacin;  Promethazine; Simvastatin; and Tetanus toxoid, adsorbed   Social History   Socioeconomic History   Marital status: Married    Spouse name: Not on file   Number of children: Not on file   Years of education: Not on file   Highest education level: Not on file  Occupational History   Not on file  Tobacco Use   Smoking status: Former    Types: Cigarettes    Passive exposure: Past   Smokeless tobacco: Never  Vaping Use   Vaping Use: Never used  Substance and Sexual Activity   Alcohol use: Not Currently   Drug use: Never   Sexual activity: Not on file  Other Topics Concern   Not on file  Social History Narrative   Not on file   Social Determinants of Health   Financial Resource Strain: Not on file  Food Insecurity: Not on file  Transportation Needs: Not on file  Physical Activity: Not on file  Stress: Not on file  Social Connections: Not on file     Family History: The patient's family history includes Cancer in his father; Heart Problems in his brother and mother; Parkinson's disease in his brother; Stroke in his mother. ROS:   Please see the history of present illness.    All other systems reviewed and are negative.  EKGs/Labs/Other Studies Reviewed:    The following studies were reviewed today:  Cardiac Studies & Procedures       ECHOCARDIOGRAM  ECHOCARDIOGRAM COMPLETE 08/21/2017             Device check shows that he is paced 99% of the time  Good  Recent Labs: 02/17/2022: BUN 43; Creatinine, Ser 1.78; NT-Pro BNP 3,844; Potassium 4.5; Sodium 142  Recent Lipid Panel    Component Value Date/Time   CHOL 171 04/28/2020 1043   TRIG 103 04/28/2020 1043   HDL 53 04/28/2020 1043   CHOLHDL 3.2 04/28/2020 1043   LDLCALC 99 04/28/2020 1043    Physical Exam:    VS:  BP (!) 160/82 (BP Location: Right Arm, Patient Position: Sitting)   Pulse 85   Ht 5\' 7"  (1.702 m)   Wt 130 lb 3.2 oz (59.1 kg)   SpO2 97%   BMI 20.39 kg/m     Wt Readings from Last 3  Encounters:  06/07/22 130 lb 3.2 oz (59.1 kg)  04/26/22 128 lb (58.1 kg)  03/15/22 132 lb (59.9 kg)     GEN:  Well nourished, well developed in no acute distress HEENT: Normal NECK: No JVD; No carotid bruits LYMPHATICS: No lymphadenopathy CARDIAC: RRR, no murmurs, rubs, gallops RESPIRATORY:  Clear to auscultation without rales, wheezing  or rhonchi  ABDOMEN: Soft, non-tender, non-distended MUSCULOSKELETAL:  No edema; No deformity  SKIN: Warm and dry NEUROLOGIC:  Alert and oriented x 3 PSYCHIATRIC:  Normal affect    Signed, [name], MD  06/07/2022 10:43 AM    [facility]

## 2022-06-07 ENCOUNTER — Encounter: Payer: Self-pay | Admitting: Cardiology

## 2022-06-07 ENCOUNTER — Ambulatory Visit: Payer: Medicare HMO | Attending: Cardiology | Admitting: Cardiology

## 2022-06-07 VITALS — BP 160/82 | HR 85 | Ht 67.0 in | Wt 130.2 lb

## 2022-06-07 DIAGNOSIS — I255 Ischemic cardiomyopathy: Secondary | ICD-10-CM | POA: Diagnosis not present

## 2022-06-07 DIAGNOSIS — I959 Hypotension, unspecified: Secondary | ICD-10-CM

## 2022-06-07 DIAGNOSIS — I4892 Unspecified atrial flutter: Secondary | ICD-10-CM

## 2022-06-07 DIAGNOSIS — I442 Atrioventricular block, complete: Secondary | ICD-10-CM

## 2022-06-07 DIAGNOSIS — I5022 Chronic systolic (congestive) heart failure: Secondary | ICD-10-CM

## 2022-06-07 DIAGNOSIS — I25119 Atherosclerotic heart disease of native coronary artery with unspecified angina pectoris: Secondary | ICD-10-CM

## 2022-06-07 DIAGNOSIS — R0603 Acute respiratory distress: Secondary | ICD-10-CM

## 2022-06-07 DIAGNOSIS — I110 Hypertensive heart disease with heart failure: Secondary | ICD-10-CM

## 2022-06-07 DIAGNOSIS — Z7901 Long term (current) use of anticoagulants: Secondary | ICD-10-CM

## 2022-06-07 DIAGNOSIS — N1832 Chronic kidney disease, stage 3b: Secondary | ICD-10-CM

## 2022-06-07 DIAGNOSIS — I509 Heart failure, unspecified: Secondary | ICD-10-CM

## 2022-06-07 DIAGNOSIS — I480 Paroxysmal atrial fibrillation: Secondary | ICD-10-CM

## 2022-06-07 HISTORY — DX: Acute respiratory distress: R06.03

## 2022-06-07 HISTORY — DX: Unspecified atrial flutter: I48.92

## 2022-06-07 HISTORY — DX: Heart failure, unspecified: I50.9

## 2022-06-07 NOTE — Patient Instructions (Addendum)
Medication Instructions:  Your physician has recommended you make the following change in your medication:   Stop Jardiance  *If you need a refill on your cardiac medications before your next appointment, please call your pharmacy*   Lab Work: None ordered If you have labs (blood work) drawn today and your tests are completely normal, you will receive your results only by: MyChart Message (if you have MyChart) OR A paper copy in the mail If you have any lab test that is abnormal or we need to change your treatment, we will call you to review the results.   Testing/Procedures: None ordered   Follow-Up: At [facility], you and your health needs are our priority.  As part of our continuing mission to provide you with exceptional heart care, we have created designated Provider Care Teams.  These Care Teams include your primary Cardiologist (physician) and Advanced Practice Providers (APPs -  Physician Assistants and Nurse Practitioners) who all work together to provide you with the care you need, when you need it.  We recommend signing up for the patient portal called "MyChart".  Sign up information is provided on this After Visit Summary.  MyChart is used to connect with patients for Virtual Visits (Telemedicine).  Patients are able to view lab/test results, encounter notes, upcoming appointments, etc.  Non-urgent messages can be sent to your provider as well.   To learn more about what you can do with MyChart, go to [email or web address].    Your next appointment:   3 month(s)  The format for your next appointment:   In Person  Provider:   [name], MD    Other Instructions none  Important Information About Sugar

## 2022-06-09 NOTE — Progress Notes (Signed)
Remote pacemaker transmission.   

## 2022-06-15 ENCOUNTER — Ambulatory Visit: Payer: Medicare HMO | Admitting: Cardiology

## 2022-06-21 ENCOUNTER — Telehealth: Payer: Self-pay

## 2022-06-21 NOTE — Telephone Encounter (Signed)
Low BP readings from home health. Forward to Dr Dulce Sellar for review

## 2022-08-07 ENCOUNTER — Ambulatory Visit (INDEPENDENT_AMBULATORY_CARE_PROVIDER_SITE_OTHER): Payer: Medicare HMO

## 2022-08-07 DIAGNOSIS — I442 Atrioventricular block, complete: Secondary | ICD-10-CM | POA: Diagnosis not present

## 2022-08-24 NOTE — Progress Notes (Signed)
Remote pacemaker transmission.   

## 2022-09-03 NOTE — Progress Notes (Unsigned)
Cardiology Office Note:    Date:  09/04/2022   ID:  Greg Oconnor, DOB 21-Dec-1925, MRN [id number]  PCP:  Greg Payment., MD  Cardiologist:  Greg Herrlich, MD    Referring MD: Greg Payment., MD    ASSESSMENT:    1. Orthostatic hypotension   2. Hypertensive heart disease with chronic systolic congestive heart failure (HCC)   3. Complete AV block (HCC)   4. Pacemaker   5. Paroxysmal atrial fibrillation (HCC)   6. On amiodarone therapy   7. Anticoagulant long-term use   8. Stage 3b chronic kidney disease (HCC)   9. Coronary artery disease involving native coronary artery of native heart with angina pectoris (HCC)    PLAN:    In order of problems listed above:  Fortunately since his daughter is given midodrine on a regular schedule we have had no further symptomatic hypotension His heart failure is compensated I told him to continue his current diuretic and not to increase his dry weight as I think he may have a risk of hospitalization with decompensated heart failure Stable pacemaker no recurrent atrial fibrillation continue his current anticoagulant   Next appointment: 6 months   Medication Adjustments/Labs and Tests Ordered: Current medicines are reviewed at length with the patient today.  Concerns regarding medicines are outlined above.  No orders of the defined types were placed in this encounter.  No orders of the defined types were placed in this encounter.    History of Present Illness:    Greg Oconnor is a 89 y.o. male with a hx of flex heart disease including hypertensive heart disease with chronic systolic heart failure heart block with permanent pacemaker paroxysmal atrial fibrillation with long-term anticoagulation CAD stage III CKD hyperlipidemia and symptomatic hypotension most recent ejection fraction performed in [facility] showed EF of 45 to 50% and he has required both midodrine and Florinef for blood pressure support.  He was last  seen 06/07/2022.  I independently reviewed the echocardiogram from [facility] there is no indication of cardiac amyloidosis.  His last device check 08/07/2022 showed to be ventricularly paced 99% of the time.  Compliance with diet, lifestyle and medications: Yes  History daughters are present they really manage him quite well Allowed his weight to go up 228 pounds were talking about increasing his diet weight again I told him I would not do that as I think he is precariously balanced between heart failure and symptomatic hypotension They are not holding his midodrine he has had no further symptomatic episodes of hypotension He is very sedentary but is not having edema shortness of breath chest pain palpitation or syncope 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 07/19/2015   BPH (benign prostatic hyperplasia) 07/20/2015   CAD, multiple vessel 07/20/2015   Cardiomyopathy (HCC) 07/20/2015   Chronic diarrhea 11/20/2018   Coronary artery disease involving native coronary artery of native heart with angina pectoris (HCC) 07/20/2015   Diabetes mellitus with stage 3 chronic kidney disease (HCC) 07/20/2015   Essential hypertension 07/20/2015   GERD without esophagitis 07/20/2015   Hepatitis A    History of pulmonary embolism 07/20/2015   Hypertensive heart disease without heart failure 06/17/2014   Mixed hyperlipidemia 07/20/2015   Old myocardial infarction 07/20/2015   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 OLD ATRIAL LEAD failure   Paroxysmal atrial fibrillation (HCC) 05/31/2015   Pyuria 06/06/2019   Stage 3  chronic kidney disease (HCC) 07/20/2015   Statin intolerance 10/13/2018   Subconjunctival hemorrhage 07/20/2015   Type 2 diabetes mellitus without complication, without long-term current use of insulin (HCC) 07/20/2015   Vitamin B12 deficiency 07/20/2015    Current Medications: Current Meds  Medication Sig   acetaminophen (TYLENOL) 325 MG tablet Take 2 tablets by mouth every 6 (six) hours as needed for moderate pain or headache.   cetirizine (ZYRTEC) 10 MG tablet Take 10 mg by mouth daily as needed for allergies.   cholestyramine (QUESTRAN) 4 GM/DOSE powder Take 4 g by mouth daily as needed (loose stools).   ELIQUIS 2.5 MG TABS tablet Take 1 tablet (2.5 mg total) by mouth 2 (two) times daily.   folic acid (FOLVITE) 1 MG tablet Take 1 tablet by mouth daily.   levothyroxine (SYNTHROID) 75 MCG tablet Take 50 mcg by mouth daily.   midodrine (PROAMATINE) 10 MG tablet Take 1 tablet (10 mg total) by mouth 2 (two) times daily. Please take this medication in the AM and Midday   nitroGLYCERIN (NITROSTAT) 0.4 MG SL tablet Place 1 tablet (0.4 mg total) under the tongue every 5 (five) minutes as needed for chest pain.   omeprazole (PRILOSEC) 20 MG capsule Take 20 mg by mouth daily.   Simethicone 125 MG CAPS Take 125 mg by mouth daily as needed (gas).   sodium chloride (OCEAN) 0.65 % SOLN nasal spray Place 1 spray into both nostrils as needed for congestion.   sucralfate (CARAFATE) 1 g tablet Take 1 g by mouth 4 (four) times daily -  with meals and at bedtime.   torsemide (DEMADEX) 10 MG tablet Take 20 mg by mouth daily as needed (edema).   trolamine salicylate (ASPERCREME) 10 % cream Apply 1 application  topically as needed for muscle pain.   vitamin B-12 (CYANOCOBALAMIN) 500 MCG tablet Take 1,000 mcg by mouth daily.   VITAMIN D, CHOLECALCIFEROL, PO Take 1 capsule by mouth daily.      EKGs/Labs/Other Studies Reviewed:    The following studies were reviewed today:  Cardiac Studies & Procedures        ECHOCARDIOGRAM  ECHOCARDIOGRAM COMPLETE 04/24/2017                 Recent Labs: 02/17/2022: BUN 43; Creatinine, Ser 1.78; NT-Pro BNP 3,844; Potassium 4.5; Sodium 142  Recent Lipid Panel    Component Value Date/Time   CHOL 171 04/28/2020 1043   TRIG 103 04/28/2020 1043   HDL 53 04/28/2020 1043   CHOLHDL 3.2 04/28/2020 1043   LDLCALC 99 04/28/2020 1043    Physical Exam:    VS:  BP (!) 142/80 (BP Location: Left Arm, Patient Position: Sitting, Cuff Size: Normal)   Pulse 90   Ht 5\' 7"  (1.702 m)   Wt 134 lb 9.6 oz (61.1 kg)   SpO2 96%   BMI 21.08 kg/m     Wt Readings from Last 3 Encounters:  09/04/22 134 lb 9.6 oz (61.1 kg)  09/04/22 134 lb 9.6 oz (61.1 kg)  06/07/22 130 lb 3.2 oz (59.1 kg)     GEN:   Well nourished, well developed in no acute distress HEENT: Normal NECK: No JVD; No carotid bruits LYMPHATICS:  No lymphadenopathy CARDIAC:  RRR, no murmurs, rubs, gallops RESPIRATORY:  Clear to auscultation without rales, wheezing or rhonchi  ABDOMEN: Soft, non-tender, non-distended MUSCULOSKELETAL:  No edema; No deformity  SKIN: Warm and dry NEUROLOGIC:  Alert and oriented x 3 PSYCHIATRIC:  Normal affect    Signed, Greg Herrlich, MD  09/04/2022 11:42 AM    [facility]

## 2022-09-04 ENCOUNTER — Ambulatory Visit (INDEPENDENT_AMBULATORY_CARE_PROVIDER_SITE_OTHER): Payer: Medicare HMO | Admitting: Cardiology

## 2022-09-04 ENCOUNTER — Ambulatory Visit: Payer: Medicare HMO | Attending: Cardiology | Admitting: Cardiology

## 2022-09-04 ENCOUNTER — Encounter: Payer: Self-pay | Admitting: Cardiology

## 2022-09-04 VITALS — BP 142/80 | HR 90 | Ht 67.0 in | Wt 134.6 lb

## 2022-09-04 DIAGNOSIS — I442 Atrioventricular block, complete: Secondary | ICD-10-CM

## 2022-09-04 DIAGNOSIS — N1832 Chronic kidney disease, stage 3b: Secondary | ICD-10-CM

## 2022-09-04 DIAGNOSIS — Z950 Presence of cardiac pacemaker: Secondary | ICD-10-CM | POA: Diagnosis not present

## 2022-09-04 DIAGNOSIS — D6869 Other thrombophilia: Secondary | ICD-10-CM

## 2022-09-04 DIAGNOSIS — Z79899 Other long term (current) drug therapy: Secondary | ICD-10-CM

## 2022-09-04 DIAGNOSIS — I4819 Other persistent atrial fibrillation: Secondary | ICD-10-CM | POA: Diagnosis not present

## 2022-09-04 DIAGNOSIS — I2510 Atherosclerotic heart disease of native coronary artery without angina pectoris: Secondary | ICD-10-CM | POA: Diagnosis not present

## 2022-09-04 DIAGNOSIS — Z7901 Long term (current) use of anticoagulants: Secondary | ICD-10-CM

## 2022-09-04 DIAGNOSIS — I110 Hypertensive heart disease with heart failure: Secondary | ICD-10-CM

## 2022-09-04 DIAGNOSIS — I951 Orthostatic hypotension: Secondary | ICD-10-CM

## 2022-09-04 DIAGNOSIS — I5022 Chronic systolic (congestive) heart failure: Secondary | ICD-10-CM

## 2022-09-04 DIAGNOSIS — I480 Paroxysmal atrial fibrillation: Secondary | ICD-10-CM

## 2022-09-04 DIAGNOSIS — I25119 Atherosclerotic heart disease of native coronary artery with unspecified angina pectoris: Secondary | ICD-10-CM

## 2022-09-04 LAB — CUP PACEART INCLINIC DEVICE CHECK
Battery Remaining Longevity: 75 mo
Battery Voltage: 2.99 V
Brady Statistic RA Percent Paced: 0 %
Brady Statistic RV Percent Paced: 99 %
Date Time Interrogation Session: 20240826105923
Implantable Lead Connection Status: 753985
Implantable Lead Connection Status: 753985
Implantable Lead Implant Date: 19980122
Implantable Lead Implant Date: 20040315
Implantable Lead Location: 753859
Implantable Lead Location: 753860
Implantable Pulse Generator Implant Date: 20201016
Lead Channel Impedance Value: 412.5 Ohm
Lead Channel Impedance Value: 625 Ohm
Lead Channel Pacing Threshold Amplitude: 0.5 V
Lead Channel Pacing Threshold Amplitude: 0.5 V
Lead Channel Pacing Threshold Pulse Width: 0.5 ms
Lead Channel Pacing Threshold Pulse Width: 0.5 ms
Lead Channel Sensing Intrinsic Amplitude: 0 mV
Lead Channel Sensing Intrinsic Amplitude: 12 mV
Lead Channel Sensing Intrinsic Amplitude: 2.4 mV
Lead Channel Setting Pacing Amplitude: 2.5 V
Lead Channel Setting Pacing Pulse Width: 0.5 ms
Lead Channel Setting Sensing Sensitivity: 4 mV
Pulse Gen Model: 2272
Pulse Gen Serial Number: 9168465

## 2022-09-04 NOTE — Progress Notes (Signed)
  Electrophysiology Office Note:   Date:  09/04/2022  ID:  Greg Oconnor, DOB [birth year], MRN [id number]  Primary Cardiologist: [name], MD Electrophysiologist: Regan Lemming, MD      History of Present Illness:   Greg Oconnor is a 89 y.o. male with h/o complete heart block, atrial fibrillation seen today for routine electrophysiology followup.  Since last being seen in our clinic the patient reports doing well.  He has no chest pain or shortness of breath.  He is able to do all of his daily activities without restriction.  He is undergoing physical therapy as he was having issues with orthostasis.  His medications have been changed and his orthostatic issues have improved.  he denies chest pain, palpitations, dyspnea, PND, orthopnea, nausea, vomiting, dizziness, syncope, edema, weight gain, or early satiety.   Review of systems complete and found to be negative unless listed in HPI.      EP Information / Studies Reviewed:    EKG is not ordered today. EKG from 04/13/22 reviewed which showed sinus rhythm, ventricular paced      PPM Interrogation-  reviewed in detail today,  See PACEART report.  Device History: Abbott Single Chamber PPM for CHB  Risk Assessment/Calculations:    CHA2DS2-VASc Score = 6   This indicates a 9.7% annual risk of stroke. The patient's score is based upon: CHF History: 1 HTN History: 1 Diabetes History: 1 Stroke History: 0 Vascular Disease History: 1 Age Score: 2 Gender Score: 0            Physical Exam:   VS:  BP (!) 142/80 (BP Location: Left Arm, Patient Position: Sitting, Cuff Size: Normal)   Pulse 90   Ht 5\' 7"  (1.702 m)   Wt 134 lb 9.6 oz (61.1 kg)   SpO2 96%   BMI 21.08 kg/m    Wt Readings from Last 3 Encounters:  09/04/22 134 lb 9.6 oz (61.1 kg)  06/07/22 130 lb 3.2 oz (59.1 kg)  04/26/22 128 lb (58.1 kg)     GEN: Well nourished, well developed in no acute distress NECK: No JVD; No carotid bruits CARDIAC:  Regular rate and rhythm, no murmurs, rubs, gallops RESPIRATORY:  Clear to auscultation without rales, wheezing or rhonchi  ABDOMEN: Soft, non-tender, non-distended EXTREMITIES:  No edema; No deformity   ASSESSMENT AND PLAN:    CHB s/p Abbott PPM  Normal PPM function See Pace Art report No changes today  2.  Coronary artery disease: No current chest pain.  Plan per primary cardiology.  3.  Persistent atrial fibrillation: Currently on warfarin and amiodarone.  Remains in sinus rhythm.  4.  Secondary hypercoagulable state: Currently on warfarin for atrial fibrillation  Disposition:   Follow up with Dr. [name] in 12 months  Signed, Pamla Pangle Jorja Loa, MD

## 2022-09-04 NOTE — Patient Instructions (Signed)
Medication Instructions:  Your physician recommends that you continue on your current medications as directed. Please refer to the Current Medication list given to you today.  *If you need a refill on your cardiac medications before your next appointment, please call your pharmacy*   Lab Work: None ordered   Testing/Procedures: None ordered   Follow-Up: At [facility], you and your health needs are our priority.  As part of our continuing mission to provide you with exceptional heart care, we have created designated Provider Care Teams.  These Care Teams include your primary Cardiologist (physician) and Advanced Practice Providers (APPs -  Physician Assistants and Nurse Practitioners) who all work together to provide you with the care you need, when you need it.  We recommend signing up for the patient portal called "MyChart".  Sign up information is provided on this After Visit Summary.  MyChart is used to connect with patients for Virtual Visits (Telemedicine).  Patients are able to view lab/test results, encounter notes, upcoming appointments, etc.  Non-urgent messages can be sent to your provider as well.   To learn more about what you can do with MyChart, go to [email or web address].    Remote monitoring is used to monitor your Pacemaker or ICD from home. This monitoring reduces the number of office visits required to check your device to one time per year. It allows [place] to keep an eye on the functioning of your device to ensure it is working properly. You are scheduled for a device check from home on 11/06/2022. You may send your transmission at any time that day. If you have a wireless device, the transmission will be sent automatically. After your physician reviews your transmission, you will receive a postcard with your next transmission date.  Your next appointment:   1 year(s)  The format for your next appointment:   In Person  Provider:   Loman Brooklyn, MD    Thank you  for choosing [facility]!!   Dory Horn, RN [phone]

## 2022-09-04 NOTE — Patient Instructions (Signed)
Medication Instructions:  Your physician recommends that you continue on your current medications as directed. Please refer to the Current Medication list given to you today.  *If you need a refill on your cardiac medications before your next appointment, please call your pharmacy*   Lab Work: None If you have labs (blood work) drawn today and your tests are completely normal, you will receive your results only by: MyChart Message (if you have MyChart) OR A paper copy in the mail If you have any lab test that is abnormal or we need to change your treatment, we will call you to review the results.   Testing/Procedures: None   Follow-Up: At [facility], you and your health needs are our priority.  As part of our continuing mission to provide you with exceptional heart care, we have created designated Provider Care Teams.  These Care Teams include your primary Cardiologist (physician) and Advanced Practice Providers (APPs -  Physician Assistants and Nurse Practitioners) who all work together to provide you with the care you need, when you need it.  We recommend signing up for the patient portal called "MyChart".  Sign up information is provided on this After Visit Summary.  MyChart is used to connect with patients for Virtual Visits (Telemedicine).  Patients are able to view lab/test results, encounter notes, upcoming appointments, etc.  Non-urgent messages can be sent to your provider as well.   To learn more about what you can do with MyChart, go to [email or web address].    Your next appointment:   3 month(s)  Provider:   Brian Munley, MD    Other Instructions None  

## 2022-09-07 ENCOUNTER — Ambulatory Visit: Payer: Medicare HMO | Admitting: Cardiology

## 2022-11-06 ENCOUNTER — Ambulatory Visit (INDEPENDENT_AMBULATORY_CARE_PROVIDER_SITE_OTHER): Payer: Medicare HMO

## 2022-11-06 DIAGNOSIS — I442 Atrioventricular block, complete: Secondary | ICD-10-CM | POA: Diagnosis not present

## 2022-11-06 LAB — CUP PACEART REMOTE DEVICE CHECK
Battery Remaining Longevity: 76 mo
Battery Remaining Percentage: 62 %
Battery Voltage: 2.99 V
Brady Statistic AS VP Percent: 97 %
Brady Statistic AS VS Percent: 1 %
Brady Statistic RV Percent Paced: 99 %
Date Time Interrogation Session: 20241028020015
Implantable Lead Connection Status: 753985
Implantable Lead Connection Status: 753985
Implantable Lead Implant Date: 19980122
Implantable Lead Implant Date: 20040315
Implantable Lead Location: 753859
Implantable Lead Location: 753860
Implantable Pulse Generator Implant Date: 20201016
Lead Channel Impedance Value: 400 Ohm
Lead Channel Impedance Value: 610 Ohm
Lead Channel Pacing Threshold Amplitude: 0.5 V
Lead Channel Pacing Threshold Pulse Width: 0.5 ms
Lead Channel Sensing Intrinsic Amplitude: 12 mV
Lead Channel Sensing Intrinsic Amplitude: 3.2 mV
Lead Channel Setting Pacing Amplitude: 2.5 V
Lead Channel Setting Pacing Pulse Width: 0.5 ms
Lead Channel Setting Sensing Sensitivity: 4 mV
Pulse Gen Model: 2272
Pulse Gen Serial Number: 9168465

## 2022-11-27 NOTE — Progress Notes (Signed)
Remote pacemaker transmission.   

## 2022-12-17 NOTE — Progress Notes (Unsigned)
Cardiology Office Note:    Date:  12/18/2022   ID:  Greg Oconnor, DOB 04/09/1925, MRN [id number]  PCP:  [name], MD  Cardiologist:  [name], MD    Referring MD: [name], MD    ASSESSMENT:    1. Hypertensive heart disease with chronic systolic congestive heart failure (HCC)   2. Orthostatic hypotension   3. Complete AV block (HCC)   4. Pacemaker   5. Coronary artery disease involving native coronary artery of native heart with angina pectoris (HCC)   6. Stage 3b chronic kidney disease (HCC)   7. Paroxysmal atrial fibrillation (HCC)   8. Anticoagulant long-term use    PLAN:    In order of problems listed above:  He is doing exceptionally well with the close attention of his daughters and then needs to have walked a tight line between symptomatic orthostatic hypotension being managed with midodrine and his heart failure being managed with weight-based diuretic.  His quality of life is good we will continue that current treatment. Stable to this maker function followed in our device clinic I reviewed his last download Continue his current reduced dose anticoagulant Stable CAD having no anginal discomfort Stable CKD   Next appointment: 6 months   Medication Adjustments/Labs and Tests Ordered: Current medicines are reviewed at length with the patient today.  Concerns regarding medicines are outlined above.  No orders of the defined types were placed in this encounter.  No orders of the defined types were placed in this encounter.    History of Present Illness:    Greg Oconnor is a 89 y.o. male with a hx of complex heart disease including hypertensive heart disease or chronic systolic heart failure heart block with permanent pacemaker atrial fibrillation with long-term anticoagulation stage III CKD coronary artery disease hyperlipidemia and most recently symptomatic hypotension orthostatic in nature.  His most recent ejection fraction 45 to 50%  he has required both midodrine and Florinef for blood pressure support.  Last seen 09/04/2022.  Labs with his PCP 11/08/2022 hemoglobin 12.2 platelet count normal 216,000 CMP creatinine 1.64 GFR stable 38 cc/min bilirubin mildly elevated 1.1 potassium 4.7 sodium 141. Last lipid profile 06/14/2021 cholesterol 162 LDL 99 non-HDL cholesterol 117.  Statin intolerant and not on lipid-lowering therapy other than Questran.  Compliance with diet, lifestyle and medications: Yes   his daughters are present as always he is managed exceptionally well and he is taking midodrine he is taking 10 mg 3 times daily not checked standing blood pressures but his settings are in the range of 1 20-1 30 systolic.  At times he is lightheaded but no syncope no edema he takes a diuretic 2 to 3 days a week based on weight no orthopnea chest pain palpitation or syncope.  They are concerned about the thought and memory they have discussed it with his PCP.  He has had no bleeding complications of his anticoagulant.  They are concerned that Prilosec may be affecting his thought and he is going to do a 2-week trial of withdrawal. 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 07/19/2015   BPH (benign prostatic hyperplasia) 07/20/2015   CAD, multiple vessel 07/20/2015  Cardiomyopathy (HCC) 07/20/2015   CHF exacerbation (HCC) 06/07/2022   Chronic diarrhea 11/20/2018   Chronic systolic congestive heart failure (HCC) 12/14/2021   Coronary artery disease involving native coronary artery of native heart with angina pectoris (HCC) 07/20/2015   Diabetes mellitus with stage 3 chronic kidney disease (HCC)  07/20/2015   Essential hypertension 07/20/2015   GERD without esophagitis 07/20/2015   Hepatitis A    History of pulmonary embolism 07/20/2015   Hypertensive heart disease without heart failure 06/17/2014   Malaise and fatigue 07/20/2015   Mixed hyperlipidemia 07/20/2015   Myalgia due to statin 06/08/2020   Old myocardial infarction 07/20/2015   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 OLD ATRIAL LEAD failure   Paroxysmal atrial fibrillation (HCC) 05/31/2015   Pyuria 06/06/2019   Respiratory distress 06/07/2022   Stage 3 chronic kidney disease (HCC) 07/20/2015   Statin intolerance 10/13/2018   Subconjunctival hemorrhage 07/20/2015   Type 2 diabetes mellitus without complication, without long-term current use of insulin (HCC) 07/20/2015   Vitamin B12 deficiency 07/20/2015    Current Medications: Current Meds  Medication Sig   acetaminophen (TYLENOL) 325 MG tablet Take 2 tablets by mouth every 6 (six) hours as needed for moderate pain or headache.   cetirizine (ZYRTEC) 10 MG tablet Take 10 mg by mouth daily as needed for allergies.   cholestyramine (QUESTRAN) 4 GM/DOSE powder Take 4 g by mouth daily as needed (loose stools).   ELIQUIS 2.5 MG TABS tablet Take 1 tablet (2.5 mg total) by mouth 2 (two) times daily.   folic acid (FOLVITE) 1 MG tablet Take 1 tablet by mouth daily.   levothyroxine (SYNTHROID) 75 MCG tablet Take 50 mcg by mouth daily.   midodrine (PROAMATINE) 10 MG tablet Take 10 mg by mouth 3 (three) times daily.   nitroGLYCERIN (NITROSTAT) 0.4 MG SL tablet Place 1 tablet (0.4 mg total) under the tongue every 5 (five) minutes as needed for chest pain.   omeprazole (PRILOSEC) 20 MG capsule Take 20 mg by mouth daily.   Simethicone 125 MG CAPS Take 125 mg by mouth daily as needed (gas).    sucralfate (CARAFATE) 1 g tablet Take 1 g by mouth as needed (antacid).   torsemide (DEMADEX) 10 MG tablet Take 20 mg by mouth daily as needed (edema).   trolamine salicylate (ASPERCREME) 10 % cream Apply 1 application  topically as needed for muscle pain.   vitamin B-12 (CYANOCOBALAMIN) 500 MCG tablet Take 1,000 mcg by mouth daily.   VITAMIN D, CHOLECALCIFEROL, PO Take 1 capsule by mouth daily.      EKGs/Labs/Other Studies Reviewed:    The following studies were reviewed today:  Cardiac Studies & Procedures       ECHOCARDIOGRAM  ECHOCARDIOGRAM COMPLETE 04/24/2017                 Recent Labs: 02/17/2022: BUN 43; Creatinine, Ser 1.78; NT-Pro BNP 3,844; Potassium 4.5; Sodium 142  Recent Lipid Panel    Component Value Date/Time   CHOL 171 04/28/2020 1043   TRIG 103 04/28/2020 1043   HDL 53 04/28/2020 1043   CHOLHDL 3.2 04/28/2020 1043   LDLCALC 99 04/28/2020 1043    Physical Exam:    VS:  BP 130/68   Pulse 88   Ht 5\' 7"  (1.702 m)   Wt 136 lb (61.7 kg)  SpO2 94%   BMI 21.30 kg/m     Wt Readings from Last 3 Encounters:  12/18/22 136 lb (61.7 kg)  09/04/22 134 lb 9.6 oz (61.1 kg)  09/04/22 134 lb 9.6 oz (61.1 kg)     GEN:  Well nourished, well developed in no acute distress HEENT: Normal NECK: No JVD; No carotid bruits LYMPHATICS: No lymphadenopathy CARDIAC: RRR, no murmurs, rubs, gallops RESPIRATORY:  Clear to auscultation without rales, wheezing or rhonchi  ABDOMEN: Soft, non-tender, non-distended MUSCULOSKELETAL:  No edema; No deformity  SKIN: Warm and dry NEUROLOGIC:  Alert and oriented x 3 PSYCHIATRIC:  Normal affect    Signed, [name], MD  12/18/2022 10:45 AM    [facility]

## 2022-12-18 ENCOUNTER — Encounter: Payer: Self-pay | Admitting: Cardiology

## 2022-12-18 ENCOUNTER — Ambulatory Visit: Payer: Medicare HMO | Attending: Cardiology | Admitting: Cardiology

## 2022-12-18 VITALS — BP 130/68 | HR 88 | Ht 67.0 in | Wt 136.0 lb

## 2022-12-18 DIAGNOSIS — Z950 Presence of cardiac pacemaker: Secondary | ICD-10-CM | POA: Diagnosis not present

## 2022-12-18 DIAGNOSIS — I480 Paroxysmal atrial fibrillation: Secondary | ICD-10-CM

## 2022-12-18 DIAGNOSIS — I110 Hypertensive heart disease with heart failure: Secondary | ICD-10-CM

## 2022-12-18 DIAGNOSIS — I951 Orthostatic hypotension: Secondary | ICD-10-CM

## 2022-12-18 DIAGNOSIS — I442 Atrioventricular block, complete: Secondary | ICD-10-CM

## 2022-12-18 DIAGNOSIS — I25119 Atherosclerotic heart disease of native coronary artery with unspecified angina pectoris: Secondary | ICD-10-CM

## 2022-12-18 DIAGNOSIS — Z79899 Other long term (current) drug therapy: Secondary | ICD-10-CM

## 2022-12-18 DIAGNOSIS — I5022 Chronic systolic (congestive) heart failure: Secondary | ICD-10-CM

## 2022-12-18 DIAGNOSIS — Z7901 Long term (current) use of anticoagulants: Secondary | ICD-10-CM

## 2022-12-18 DIAGNOSIS — N1832 Chronic kidney disease, stage 3b: Secondary | ICD-10-CM

## 2022-12-18 NOTE — Patient Instructions (Addendum)

## 2023-01-09 ENCOUNTER — Other Ambulatory Visit: Payer: Self-pay | Admitting: Cardiology

## 2023-01-09 NOTE — Telephone Encounter (Signed)
Pt last saw Dr Dulce Sellar 12/18/22, last labs 11/08/22 Creat 1.64, age 89, weight 61.7kg, based on specified criteria pt is on appropriate dosage of Eliquis 2.5mg  BID for afib.  Will refill rx.

## 2023-02-05 ENCOUNTER — Ambulatory Visit (INDEPENDENT_AMBULATORY_CARE_PROVIDER_SITE_OTHER): Payer: Medicare HMO

## 2023-02-05 DIAGNOSIS — I442 Atrioventricular block, complete: Secondary | ICD-10-CM | POA: Diagnosis not present

## 2023-02-05 LAB — CUP PACEART REMOTE DEVICE CHECK
Battery Remaining Longevity: 73 mo
Battery Remaining Percentage: 60 %
Battery Voltage: 2.99 V
Brady Statistic AS VP Percent: 96 %
Brady Statistic AS VS Percent: 1 %
Brady Statistic RV Percent Paced: 98 %
Date Time Interrogation Session: 20250127020019
Implantable Lead Connection Status: 753985
Implantable Lead Connection Status: 753985
Implantable Lead Implant Date: 19980122
Implantable Lead Implant Date: 20040315
Implantable Lead Location: 753859
Implantable Lead Location: 753860
Implantable Pulse Generator Implant Date: 20201016
Lead Channel Impedance Value: 410 Ohm
Lead Channel Impedance Value: 610 Ohm
Lead Channel Pacing Threshold Amplitude: 0.5 V
Lead Channel Pacing Threshold Pulse Width: 0.5 ms
Lead Channel Sensing Intrinsic Amplitude: 12 mV
Lead Channel Sensing Intrinsic Amplitude: 3 mV
Lead Channel Setting Pacing Amplitude: 2.5 V
Lead Channel Setting Pacing Pulse Width: 0.5 ms
Lead Channel Setting Sensing Sensitivity: 4 mV
Pulse Gen Model: 2272
Pulse Gen Serial Number: 9168465

## 2023-02-23 ENCOUNTER — Ambulatory Visit (HOSPITAL_BASED_OUTPATIENT_CLINIC_OR_DEPARTMENT_OTHER)
Admission: EM | Admit: 2023-02-23 | Discharge: 2023-02-23 | Disposition: A | Discharge status: Home / Self Care | Payer: PPO

## 2023-02-23 DIAGNOSIS — S0990XA Unspecified injury of head, initial encounter: Secondary | ICD-10-CM

## 2023-02-23 NOTE — ED Notes (Signed)
Directed to ER by provider.

## 2023-02-23 NOTE — ED Provider Notes (Signed)
Seen briefly in triage Here with daughter This morning around 4:30 AM he fell and hit his head. On eliquis  There is significant ecchymosis around left eye with hematoma of left forehead  Discussed with daughter fall on blood thinner and patient age will need CT scan to rule out intracranial bleed. Discussed urgent care does not have this capability. Recommend transport via ambulance. However daughter is comfortable bringing him to nearby ED via POV   Julyana Woolverton, Ray Church 02/23/23 1039

## 2023-02-23 NOTE — ED Triage Notes (Signed)
Fall at 0430am. Sitter lives with patient.  Patient has orthostatic hypotension and apparently stood up to use bedside commode when collapsed. Patient has significant swelling, discoloration to left eye. Multiple skin tears. Patient is on Eliquis. Provider brought to bedside to perform exam.  Patient will be directed to ER. Daughter reports she is comfortable taking patient by private car.

## 2023-03-19 NOTE — Progress Notes (Signed)
 Remote pacemaker transmission.

## 2023-03-26 ENCOUNTER — Telehealth: Payer: Self-pay | Admitting: Cardiology

## 2023-03-26 DIAGNOSIS — K219 Gastro-esophageal reflux disease without esophagitis: Secondary | ICD-10-CM

## 2023-03-26 DIAGNOSIS — N183 Chronic kidney disease, stage 3 unspecified: Secondary | ICD-10-CM

## 2023-03-26 DIAGNOSIS — I5022 Chronic systolic (congestive) heart failure: Secondary | ICD-10-CM | POA: Diagnosis not present

## 2023-03-26 DIAGNOSIS — E1122 Type 2 diabetes mellitus with diabetic chronic kidney disease: Secondary | ICD-10-CM | POA: Diagnosis not present

## 2023-03-26 DIAGNOSIS — E039 Hypothyroidism, unspecified: Secondary | ICD-10-CM | POA: Diagnosis not present

## 2023-03-26 DIAGNOSIS — E559 Vitamin D deficiency, unspecified: Secondary | ICD-10-CM

## 2023-03-26 DIAGNOSIS — E785 Hyperlipidemia, unspecified: Secondary | ICD-10-CM

## 2023-03-26 DIAGNOSIS — M6281 Muscle weakness (generalized): Secondary | ICD-10-CM

## 2023-03-26 DIAGNOSIS — I4891 Unspecified atrial fibrillation: Secondary | ICD-10-CM | POA: Diagnosis not present

## 2023-03-26 DIAGNOSIS — I739 Peripheral vascular disease, unspecified: Secondary | ICD-10-CM

## 2023-03-26 DIAGNOSIS — I2510 Atherosclerotic heart disease of native coronary artery without angina pectoris: Secondary | ICD-10-CM

## 2023-03-26 DIAGNOSIS — N400 Enlarged prostate without lower urinary tract symptoms: Secondary | ICD-10-CM

## 2023-03-26 NOTE — Telephone Encounter (Signed)
 Please call patient's daughter Olegario Messier to answer questions about patient moving to [facility] and how to correctly move the device that transmits remote transmissions for her father's pacemaker.  Best number [phone]

## 2023-03-26 NOTE — Telephone Encounter (Signed)
 Called and spoke w/ Olegario Messier. Father is moving into assisted living and had questions about how that works with remote monitoring. I told her just to plug it in near where he sleeps there just as he did before. It will be about 6 feet from where he sleeps and will be fine for remote monitoring purposes. Patients daughter expressed appreciation for call and clarification.

## 2023-03-28 DIAGNOSIS — R600 Localized edema: Secondary | ICD-10-CM

## 2023-03-28 DIAGNOSIS — I5022 Chronic systolic (congestive) heart failure: Secondary | ICD-10-CM

## 2023-04-11 DIAGNOSIS — F0390 Unspecified dementia without behavioral disturbance: Secondary | ICD-10-CM | POA: Diagnosis not present

## 2023-04-11 DIAGNOSIS — N3946 Mixed incontinence: Secondary | ICD-10-CM | POA: Diagnosis not present

## 2023-04-11 DIAGNOSIS — M6281 Muscle weakness (generalized): Secondary | ICD-10-CM | POA: Diagnosis not present

## 2023-04-11 DIAGNOSIS — L22 Diaper dermatitis: Secondary | ICD-10-CM | POA: Diagnosis not present

## 2023-04-14 ENCOUNTER — Other Ambulatory Visit: Payer: Self-pay | Admitting: Internal Medicine

## 2023-04-29 DIAGNOSIS — I509 Heart failure, unspecified: Secondary | ICD-10-CM | POA: Diagnosis not present

## 2023-04-29 DIAGNOSIS — J90 Pleural effusion, not elsewhere classified: Secondary | ICD-10-CM | POA: Diagnosis not present

## 2023-04-29 DIAGNOSIS — Z950 Presence of cardiac pacemaker: Secondary | ICD-10-CM | POA: Diagnosis not present

## 2023-04-30 DIAGNOSIS — I5042 Chronic combined systolic (congestive) and diastolic (congestive) heart failure: Secondary | ICD-10-CM | POA: Diagnosis not present

## 2023-04-30 DIAGNOSIS — I951 Orthostatic hypotension: Secondary | ICD-10-CM | POA: Diagnosis not present

## 2023-04-30 DIAGNOSIS — I4892 Unspecified atrial flutter: Secondary | ICD-10-CM | POA: Diagnosis not present

## 2023-04-30 DIAGNOSIS — N189 Chronic kidney disease, unspecified: Secondary | ICD-10-CM | POA: Diagnosis not present

## 2023-05-01 DIAGNOSIS — I5042 Chronic combined systolic (congestive) and diastolic (congestive) heart failure: Secondary | ICD-10-CM | POA: Diagnosis not present

## 2023-05-01 DIAGNOSIS — I4892 Unspecified atrial flutter: Secondary | ICD-10-CM | POA: Diagnosis not present

## 2023-05-01 DIAGNOSIS — N189 Chronic kidney disease, unspecified: Secondary | ICD-10-CM | POA: Diagnosis not present

## 2023-05-01 DIAGNOSIS — I951 Orthostatic hypotension: Secondary | ICD-10-CM | POA: Diagnosis not present

## 2023-05-02 DIAGNOSIS — I4510 Unspecified right bundle-branch block: Secondary | ICD-10-CM | POA: Diagnosis not present

## 2023-05-10 DIAGNOSIS — I340 Nonrheumatic mitral (valve) insufficiency: Secondary | ICD-10-CM | POA: Diagnosis not present

## 2023-05-10 DIAGNOSIS — I371 Nonrheumatic pulmonary valve insufficiency: Secondary | ICD-10-CM | POA: Diagnosis not present

## 2023-05-10 DIAGNOSIS — I361 Nonrheumatic tricuspid (valve) insufficiency: Secondary | ICD-10-CM | POA: Diagnosis not present

## 2023-05-10 LAB — LAB REPORT - SCANNED: EGFR: 43

## 2023-05-11 ENCOUNTER — Ambulatory Visit (INDEPENDENT_AMBULATORY_CARE_PROVIDER_SITE_OTHER)

## 2023-05-11 DIAGNOSIS — I442 Atrioventricular block, complete: Secondary | ICD-10-CM | POA: Diagnosis not present

## 2023-05-14 LAB — CUP PACEART REMOTE DEVICE CHECK
Battery Remaining Longevity: 68 mo
Battery Remaining Percentage: 57 %
Battery Voltage: 2.99 V
Brady Statistic AS VP Percent: 95 %
Brady Statistic AS VS Percent: 1.4 %
Brady Statistic RV Percent Paced: 97 %
Date Time Interrogation Session: 20250502190017
Implantable Lead Connection Status: 753985
Implantable Lead Connection Status: 753985
Implantable Lead Implant Date: 19980122
Implantable Lead Implant Date: 20040315
Implantable Lead Location: 753859
Implantable Lead Location: 753860
Implantable Pulse Generator Implant Date: 20201016
Lead Channel Impedance Value: 380 Ohm
Lead Channel Impedance Value: 560 Ohm
Lead Channel Pacing Threshold Amplitude: 0.5 V
Lead Channel Pacing Threshold Pulse Width: 0.5 ms
Lead Channel Sensing Intrinsic Amplitude: 1.5 mV
Lead Channel Sensing Intrinsic Amplitude: 7.5 mV
Lead Channel Setting Pacing Amplitude: 2.5 V
Lead Channel Setting Pacing Pulse Width: 0.5 ms
Lead Channel Setting Sensing Sensitivity: 4 mV
Pulse Gen Model: 2272
Pulse Gen Serial Number: 9168465

## 2023-05-17 NOTE — Progress Notes (Signed)
 Cardiology Office Note:    Date:  05/18/2023   ID:  Greg Oconnor, DOB 06-30-1925, MRN [id number]  PCP:  Tita Form, MD   [facility] Providers Cardiologist:  Zoe Hinds, MD Electrophysiologist:  Lei Pump, MD     Referring MD: Tita Form, MD     History of Present Illness:    Greg Oconnor is a 89 y.o. male with a hx of complete heart block s/p [facility] Jude PPM in 2020, HFrEF, hypertension, CAD s/p stenting and CABG in 2005, paroxysmal atrial fibrillation, GERD, DM 2, CKD stage III, chronic anemia, hyperlipidemia with statin intolerance.  05/14/2023 device check normal device function 05/10/2023 echo EF 35 to 40%, mild MR, moderate aortic sclerosis without stenosis, mild TR 04/13/2022 echo EF of 45 to 50%, mild to moderate MR, mild to moderate TR 11/2021 echo EF of 25 to 30%, moderate mitral regurgitation, mild aortic sclerosis without stenosis, right ventricular systolic pressure mildly elevated at 37 mmHg. 10/25/2018 ppm generator change out 2005 CABG x 4  Initial PPM implanted in 1998 for CHB >> 2005 CABG >> PPM generator change out in 2020.  He was admitted to end of hospital most recently 04/12/2022 acute renal failure after presenting from his SNF complains of dizziness and URI symptoms, proBNP elevated 13,000, diuresed 12 L and underwent left-sided thoracentesis, amiodarone  was ultimately discontinued secondary to elevated liver function testing.  Most recently was evaluated by Dr. Sandee Crook on 12/18/2022, bothered by orthostatic hypotension but his symptoms were well-controlled as his daughter was making sure he was taking his midodrine  on a routine basis, heart failure was compensated, no changes were made to plan of care as advised follow-up in 6 months.  Admitted to [facility] 04/26/2023 to 05/10/2023 for acute on chronic heart failure exacerbation after patient standing from his SNF complaining of progressive shortness of breath the following few days.  In  the ED was started on BiPAP, proBNP greater than 16,000, chest x-ray demonstrated moderately large left-sided pleural effusion with pulmonary edema, he was diuresed and eventually discharged on 05/10/2023.    He presents today companied by 2 of his daughters for follow-up for recent hospitalization as outlined above.  He is now residing [facility], with hopes of being able to return to his ALF.  His weight was stable however he had a sudden gain and his torsemide  had been increased from 10 mg each day to 40 mg each day starting today.  He does have some complaints of shortness of breath today but he is in no acute distress.  He also has some pedal edema. He denies chest pain, palpitations, pnd, orthopnea, n, v, dizziness, syncope, or early satiety.   Review of Systems  Constitutional:  Positive for malaise/fatigue.  Respiratory:  Positive for shortness of breath.   Cardiovascular:  Positive for leg swelling.  All other systems reviewed and are negative.   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 07/19/2015   BPH (benign prostatic hyperplasia) 07/20/2015   CAD, multiple vessel 07/20/2015   Cardiomyopathy (HCC) 07/20/2015   CHF exacerbation (HCC) 06/07/2022   Chronic diarrhea 11/20/2018   Chronic systolic congestive heart failure (HCC) 12/14/2021   Coronary  artery disease involving native coronary artery of native heart with angina pectoris (HCC) 07/20/2015   Diabetes mellitus with stage 3 chronic kidney disease (HCC) 07/20/2015   Essential hypertension 07/20/2015   GERD without esophagitis 07/20/2015   Hepatitis A    History of pulmonary embolism  07/20/2015   Hypertensive heart disease without heart failure 06/17/2014   Malaise and fatigue 07/20/2015   Mixed hyperlipidemia 07/20/2015   Myalgia due to statin 06/08/2020   Old myocardial infarction 07/20/2015   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 OLD ATRIAL LEAD failure   Paroxysmal atrial fibrillation (HCC) 05/31/2015   Pyuria 06/06/2019   Respiratory distress 06/07/2022   Stage 3 chronic kidney disease (HCC) 07/20/2015   Statin intolerance 10/13/2018   Subconjunctival hemorrhage 07/20/2015   Type 2 diabetes mellitus without complication, without long-term current use of insulin (HCC) 07/20/2015   Vitamin B12 deficiency 07/20/2015    Past Surgical History:  Procedure Laterality Date   APPENDECTOMY     CHOLECYSTECTOMY     CORONARY ARTERY BYPASS GRAFT     CYSTOSCOPY     INSERT / REPLACE / REMOVE PACEMAKER     [facility]   PPM GENERATOR CHANGEOUT N/A 10/25/2018   Procedure: PPM GENERATOR CHANGEOUT;  Surgeon: Lei Pump, MD;  Location: [facility] INVASIVE CV LAB;  Service: Cardiovascular;  Laterality: N/A;   PROSTATECTOMY      Current Medications: Current Meds  Medication Sig   acetaminophen  (TYLENOL ) 325 MG tablet Take 2 tablets by mouth every 6 (six) hours as needed for moderate pain or headache.   cetirizine (ZYRTEC) 10 MG tablet Take 10 mg by mouth daily as needed for allergies.   cholestyramine (QUESTRAN) 4 GM/DOSE powder Take 4 g by mouth daily as needed (loose stools).   ELIQUIS  2.5 MG TABS tablet TAKE 1 TABLET BY MOUTH TWICE DAILY   folic acid (FOLVITE) 1 MG tablet Take 1 tablet by mouth daily.   levothyroxine (SYNTHROID) 75 MCG tablet Take 50 mcg by mouth daily.   midodrine  (PROAMATINE ) 10 MG tablet Take 10 mg by mouth 3 (three) times daily.   nitroGLYCERIN  (NITROSTAT ) 0.4 MG SL  tablet Place 1 tablet (0.4 mg total) under the tongue every 5 (five) minutes as needed for chest pain.   omeprazole (PRILOSEC) 20 MG capsule Take 20 mg by mouth daily.   Simethicone 125 MG CAPS Take 125 mg by mouth daily as needed (gas).   sucralfate (CARAFATE) 1 g tablet Take 1 g by mouth as needed (antacid).   torsemide  (DEMADEX ) 20 MG tablet Take 40 mg by mouth daily.   trolamine salicylate (ASPERCREME) 10 % cream Apply 1 application  topically as needed for muscle pain.   vitamin B-12 (CYANOCOBALAMIN) 500 MCG tablet Take 1,000 mcg by mouth daily.   VITAMIN D, CHOLECALCIFEROL, PO Take 1 capsule by mouth daily.     Allergies:   Ezetimibe; Fenofibrate; Fenofibrate micronized; Fish oil; Niacin; Promethazine; Simvastatin; and Tetanus toxoid, adsorbed   Social History   Socioeconomic History   Marital status: Married    Spouse name: Not on file   Number of children: Not on file   Years of education: Not on file   Highest education level: Not on file  Occupational History   Not on file  Tobacco Use   Smoking status: Former    Types:  Cigarettes    Passive exposure: Past   Smokeless tobacco: Never  Vaping Use   Vaping status: Never Used  Substance and Sexual Activity   Alcohol use: Not Currently   Drug use: Never   Sexual activity: Not on file  Other Topics Concern   Not on file  Social History Narrative   Not on file   Social Drivers of Health   Financial Resource Strain: Not on file  Food Insecurity: Not on file  Transportation Needs: Not on file  Physical Activity: Not on file  Stress: Not on file  Social Connections: Not on file     Family History: The patient's family history includes Cancer in his father; Heart Problems in his brother and mother; Parkinson's disease in his brother; Stroke in his mother.  ROS:   Please see the history of present illness.     All other systems reviewed and are negative.  EKGs/Labs/Other Studies Reviewed:    The following  studies were reviewed today: Cardiac Studies & Procedures   ______________________________________________________________________________________________     ECHOCARDIOGRAM  ECHOCARDIOGRAM COMPLETE 04/24/2017          ______________________________________________________________________________________________       EKG:  EKG is  ordered today.  The ekg ordered today demonstrates atrial-sensed, v-paced, HR 55 bpm.   Recent Labs: No results found for requested labs within last 365 days.  Recent Lipid Panel    Component Value Date/Time   CHOL 171 04/28/2020 1043   TRIG 103 04/28/2020 1043   HDL 53 04/28/2020 1043   CHOLHDL 3.2 04/28/2020 1043   LDLCALC 99 04/28/2020 1043     Risk Assessment/Calculations:    CHA2DS2-VASc Score =     This indicates a  % annual risk of stroke. The patient's score is based upon:       HYPERTENSION CONTROL Vitals:   05/18/23 0956 05/18/23 1035  BP: (!) 130/90 (!) 130/90    The patient's blood pressure is elevated above target today.  In order to address the patient's elevated BP: Blood pressure will be monitored at home to determine if medication changes need to be made.            Physical Exam:    VS:  BP (!) 130/90   Pulse 93   Ht 5\' 7"  (1.702 m)   Wt 139 lb (63 kg)   SpO2 95%   BMI 21.77 kg/m     Wt Readings from Last 3 Encounters:  05/18/23 139 lb (63 kg)  12/18/22 136 lb (61.7 kg)  09/04/22 134 lb 9.6 oz (61.1 kg)     GEN: Frail, well groomed, no acute distress HEENT: Normal NECK: No JVD; No carotid bruits LYMPHATICS: No lymphadenopathy CARDIAC: RRR, no murmurs, rubs, gallops RESPIRATORY:  mid left lung and LLL sounds are diminished ABDOMEN: Soft, non-tender, non-distended MUSCULOSKELETAL:  +2 pedal edema in Left leg, +1 in right leg; No deformity  SKIN: Warm and dry NEUROLOGIC:  Alert and oriented x 3 PSYCHIATRIC:  Normal affect   ASSESSMENT:    1. HFrEF (heart failure with reduced ejection  fraction) (HCC)   2. Paroxysmal atrial fibrillation (HCC)   3. Hypercoagulable state (HCC)   4. Complete AV block (HCC)   5. Pacemaker   6. Orthostatic hypotension   7. Pleural effusion     PLAN:    In order of problems listed above:  HFrEF/ICM-NYHA class II-III, he is volume overloaded in the facility at his SNF appropriately increase his torsemide  today to 40  mg.  He does have decreased lung sounds in his left lower lung, this is consistent with the pleural effusion that he had when he was hospitalized.  Will arrange for repeat chest x-ray at his facility.  Continue Demadex  currently taking 40 mg daily, further GDMT has been discontinued secondary to ongoing orthostasis.  Left pleural effusion-we underwent a thoracentesis, chest x-ray on day of discharge reported mild pleural effusion, it sounds like this may have progressed somewhat.  Will arrange for portable chest x-ray at his facility.  PAF/anticoagulation long-term use-no recurrence of atrial fibrillation, CHA2DS2-VASc score of 6, continue Eliquis  2.5 mg twice daily--currently meets reduced dose criteria based on age and creatinine.  He just had blood work at his SNF yesterday, family reports that it was normal.    Complete heart block/s/p PPM -most recent device check revealed normal parameters.  Orthostatic hypotension-he is mostly wheelchair-bound, his antihypertensives including his beta-blockers have all been discontinued.  Continue midodrine .  CKD stage IIIb-creatinine on day of discharge was 1.5, family states lab work was checked at his facility yesterday and his creatinine was reportedly 1.1.   Disposition-continue with daily weights at facility, torsemide  just increased today at SNF to 40 mg daily, will order repeat CXR. Keep follow up with Dr. Sandee Crook in June.            Medication Adjustments/Labs and Tests Ordered: Current medicines are reviewed at length with the patient today.  Concerns regarding medicines are  outlined above.  No orders of the defined types were placed in this encounter.  No orders of the defined types were placed in this encounter.   Patient Instructions  Medication Instructions:  Your physician recommends that you continue on your current medications as directed. Please refer to the Current Medication list given to you today.  *If you need a refill on your cardiac medications before your next appointment, please call your pharmacy*   Lab Work: None ordered If you have labs (blood work) drawn today and your tests are completely normal, you will receive your results only by: MyChart Message (if you have MyChart) OR A paper copy in the mail If you have any lab test that is abnormal or we need to change your treatment, we will call you to review the results.   Testing/Procedures: None ordered   Follow-Up: At [facility], you and your health needs are our priority.  As part of our continuing mission to provide you with exceptional heart care, we have created designated Provider Care Teams.  These Care Teams include your primary Cardiologist (physician) and Advanced Practice Providers (APPs -  Physician Assistants and Nurse Practitioners) who all work together to provide you with the care you need, when you need it.  We recommend signing up for the patient portal called "MyChart".  Sign up information is provided on this After Visit Summary.  MyChart is used to connect with patients for Virtual Visits (Telemedicine).  Patients are able to view lab/test results, encounter notes, upcoming appointments, etc.  Non-urgent messages can be sent to your provider as well.   To learn more about what you can do with MyChart, go to [email or web address].    Your next appointment:   Keep your scheduled appointment with Dr. Sandee Crook  The format for your next appointment:   In Person  Provider:   Zoe Hinds, MD    Other Instructions none  Important Information About  Sugar        Signed, Terrance Ferretti, NP  05/18/2023 10:43 AM    [facility]

## 2023-05-18 ENCOUNTER — Encounter: Payer: Self-pay | Admitting: Cardiology

## 2023-05-18 ENCOUNTER — Ambulatory Visit: Attending: Cardiology | Admitting: Cardiology

## 2023-05-18 VITALS — BP 130/90 | HR 93 | Ht 67.0 in | Wt 139.0 lb

## 2023-05-18 DIAGNOSIS — D6859 Other primary thrombophilia: Secondary | ICD-10-CM

## 2023-05-18 DIAGNOSIS — Z950 Presence of cardiac pacemaker: Secondary | ICD-10-CM

## 2023-05-18 DIAGNOSIS — I442 Atrioventricular block, complete: Secondary | ICD-10-CM

## 2023-05-18 DIAGNOSIS — I951 Orthostatic hypotension: Secondary | ICD-10-CM

## 2023-05-18 DIAGNOSIS — I5020 Unspecified systolic (congestive) heart failure: Secondary | ICD-10-CM

## 2023-05-18 DIAGNOSIS — J90 Pleural effusion, not elsewhere classified: Secondary | ICD-10-CM

## 2023-05-18 DIAGNOSIS — I480 Paroxysmal atrial fibrillation: Secondary | ICD-10-CM

## 2023-05-18 NOTE — Patient Instructions (Signed)
 Medication Instructions:  Your physician recommends that you continue on your current medications as directed. Please refer to the Current Medication list given to you today.  *If you need a refill on your cardiac medications before your next appointment, please call your pharmacy*   Lab Work: None ordered If you have labs (blood work) drawn today and your tests are completely normal, you will receive your results only by: MyChart Message (if you have MyChart) OR A paper copy in the mail If you have any lab test that is abnormal or we need to change your treatment, we will call you to review the results.   Testing/Procedures: None ordered   Follow-Up: At [facility], you and your health needs are our priority.  As part of our continuing mission to provide you with exceptional heart care, we have created designated Provider Care Teams.  These Care Teams include your primary Cardiologist (physician) and Advanced Practice Providers (APPs -  Physician Assistants and Nurse Practitioners) who all work together to provide you with the care you need, when you need it.  We recommend signing up for the patient portal called "MyChart".  Sign up information is provided on this After Visit Summary.  MyChart is used to connect with patients for Virtual Visits (Telemedicine).  Patients are able to view lab/test results, encounter notes, upcoming appointments, etc.  Non-urgent messages can be sent to your provider as well.   To learn more about what you can do with MyChart, go to [email or web address].    Your next appointment:   Keep your scheduled appointment with Dr. Sandee Crook  The format for your next appointment:   In Person  Provider:   Zoe Hinds, MD    Other Instructions none  Important Information About Sugar

## 2023-06-01 DIAGNOSIS — D649 Anemia, unspecified: Secondary | ICD-10-CM

## 2023-06-01 DIAGNOSIS — J449 Chronic obstructive pulmonary disease, unspecified: Secondary | ICD-10-CM | POA: Diagnosis not present

## 2023-06-01 DIAGNOSIS — I951 Orthostatic hypotension: Secondary | ICD-10-CM

## 2023-06-01 DIAGNOSIS — K219 Gastro-esophageal reflux disease without esophagitis: Secondary | ICD-10-CM

## 2023-06-01 DIAGNOSIS — E1122 Type 2 diabetes mellitus with diabetic chronic kidney disease: Secondary | ICD-10-CM | POA: Diagnosis not present

## 2023-06-01 DIAGNOSIS — F419 Anxiety disorder, unspecified: Secondary | ICD-10-CM

## 2023-06-01 DIAGNOSIS — N1832 Chronic kidney disease, stage 3b: Secondary | ICD-10-CM | POA: Diagnosis not present

## 2023-06-01 DIAGNOSIS — M6281 Muscle weakness (generalized): Secondary | ICD-10-CM

## 2023-06-01 DIAGNOSIS — I5043 Acute on chronic combined systolic (congestive) and diastolic (congestive) heart failure: Secondary | ICD-10-CM | POA: Diagnosis not present

## 2023-06-01 DIAGNOSIS — I480 Paroxysmal atrial fibrillation: Secondary | ICD-10-CM

## 2023-06-01 DIAGNOSIS — Z950 Presence of cardiac pacemaker: Secondary | ICD-10-CM

## 2023-06-01 DIAGNOSIS — E039 Hypothyroidism, unspecified: Secondary | ICD-10-CM

## 2023-06-06 DIAGNOSIS — D649 Anemia, unspecified: Secondary | ICD-10-CM | POA: Diagnosis not present

## 2023-06-06 DIAGNOSIS — I480 Paroxysmal atrial fibrillation: Secondary | ICD-10-CM | POA: Diagnosis not present

## 2023-06-06 DIAGNOSIS — N1832 Chronic kidney disease, stage 3b: Secondary | ICD-10-CM | POA: Diagnosis not present

## 2023-06-06 DIAGNOSIS — F0390 Unspecified dementia without behavioral disturbance: Secondary | ICD-10-CM

## 2023-06-06 DIAGNOSIS — I5043 Acute on chronic combined systolic (congestive) and diastolic (congestive) heart failure: Secondary | ICD-10-CM | POA: Diagnosis not present

## 2023-06-13 DIAGNOSIS — R600 Localized edema: Secondary | ICD-10-CM | POA: Diagnosis not present

## 2023-06-13 DIAGNOSIS — F0390 Unspecified dementia without behavioral disturbance: Secondary | ICD-10-CM | POA: Diagnosis not present

## 2023-06-13 DIAGNOSIS — M6281 Muscle weakness (generalized): Secondary | ICD-10-CM | POA: Diagnosis not present

## 2023-06-21 NOTE — Addendum Note (Signed)
 Addended by: Lott Rouleau A on: 06/21/2023 08:34 AM   Modules accepted: Orders

## 2023-06-21 NOTE — Progress Notes (Signed)
 Remote pacemaker transmission.

## 2023-06-27 ENCOUNTER — Ambulatory Visit: Admitting: Cardiology

## 2023-06-28 ENCOUNTER — Ambulatory Visit

## 2023-06-28 VITALS — BP 114/60 | HR 80 | Ht 67.6 in | Wt 129.2 lb

## 2023-06-28 DIAGNOSIS — I951 Orthostatic hypotension: Secondary | ICD-10-CM

## 2023-06-28 DIAGNOSIS — I2510 Atherosclerotic heart disease of native coronary artery without angina pectoris: Secondary | ICD-10-CM

## 2023-06-28 DIAGNOSIS — Z950 Presence of cardiac pacemaker: Secondary | ICD-10-CM

## 2023-06-28 DIAGNOSIS — I480 Paroxysmal atrial fibrillation: Secondary | ICD-10-CM

## 2023-06-28 DIAGNOSIS — I5022 Chronic systolic (congestive) heart failure: Secondary | ICD-10-CM

## 2023-06-28 NOTE — Assessment & Plan Note (Signed)
 Continues to follow-up with device clinic consistently.  Last remote check was May 2025 with normal functioning device.  Continue to monitor with device clinic.  Has follow-up visit with Dr. Lawana Pray in August

## 2023-06-28 NOTE — Assessment & Plan Note (Signed)
 Now on complete heart block pacemaker dependent.  Continue with anticoagulation Eliquis  low-dose considering age and weight. Tolerating well.  No complications.

## 2023-06-28 NOTE — Assessment & Plan Note (Signed)
 Reduced LVEF 35 to 40% on echocardiogram May 2025. Appears euvolemic and compensated. Weight 129 pounds which appears to have been stable. Remains on torsemide  40 mg once daily. Remains on low-salt diet.  Advised to continue monitoring.  Guideline directed medical therapy limited due to soft blood pressures and orthostatic hypotension. Remains on midodrine  3 times daily for blood pressure optimization.  Continue with watchful monitoring and fluid status maintenance.

## 2023-06-28 NOTE — Patient Instructions (Signed)
 Medication Instructions:  Your physician recommends that you continue on your current medications as directed. Please refer to the Current Medication list given to you today.  *If you need a refill on your cardiac medications before your next appointment, please call your pharmacy*  Lab Work: None If you have labs (blood work) drawn today and your tests are completely normal, you will receive your results only by: MyChart Message (if you have MyChart) OR A paper copy in the mail If you have any lab test that is abnormal or we need to change your treatment, we will call you to review the results.  Testing/Procedures: None  Follow-Up: At [facility], you and your health needs are our priority.  As part of our continuing mission to provide you with exceptional heart care, our providers are all part of one team.  This team includes your primary Cardiologist (physician) and Advanced Practice Providers or APPs (Physician Assistants and Nurse Practitioners) who all work together to provide you with the care you need, when you need it.  Your next appointment:   3 month(s)  Provider:   Huntley Dec, MD    We recommend signing up for the patient portal called "MyChart".  Sign up information is provided on this After Visit Summary.  MyChart is used to connect with patients for Virtual Visits (Telemedicine).  Patients are able to view lab/test results, encounter notes, upcoming appointments, etc.  Non-urgent messages can be sent to your provider as well.   To learn more about what you can do with MyChart, go to [email or web address].   Other Instructions None

## 2023-06-28 NOTE — Assessment & Plan Note (Signed)
 Symptomatic orthostatic hypotension with falls in the past. Has been well-controlled on midodrine . Continue the same.

## 2023-06-28 NOTE — Assessment & Plan Note (Signed)
 History of CAD s/p CABG. Remains on Eliquis  for atrial fibrillation.  Hence not on aspirin. Asymptomatic with functional status limited due to age and deconditioning. No active symptoms suggestive of angina.

## 2023-06-28 NOTE — Progress Notes (Signed)
 Cardiology Consultation:    Date:  06/28/2023   ID:  Greg Oconnor, DOB 09-Nov-1925, MRN [id number]  PCP:  Tita Form, MD  Cardiologist:  Daymon Evans Remi Rester, MD   Referring MD: Abbe Hoard., MD   No chief complaint on file.    ASSESSMENT AND PLAN:   Mr. Shrewsbury is a 89-year-old male patient complete heart block s/p [facility] Jude pacemaker in 2020, chronic heart failure with reduced ejection fraction, recent LVEF 35 to 40% on echocardiogram May 2025, mild MR, CAD s/p stenting and CABG in 2005, hypertension, paroxysmal atrial fibrillation, diabetes mellitus, CKD stage III, GERD, chronic anemia, hyperlipidemia and statin intolerance, orthostatic hypotension.  Problem List Items Addressed This Visit     Orthostatic hypotension   Symptomatic orthostatic hypotension with falls in the past. Has been well-controlled on midodrine . Continue the same.       Pacemaker   Continues to follow-up with device clinic consistently.  Last remote check was May 2025 with normal functioning device.  Continue to monitor with device clinic.  Has follow-up visit with Dr. Lawana Pray in August      Paroxysmal atrial fibrillation [facility])   Now on complete heart block pacemaker dependent.  Continue with anticoagulation Eliquis  low-dose considering age and weight. Tolerating well.  No complications.      CAD, multiple vessel - Primary   History of CAD s/p CABG. Remains on Eliquis  for atrial fibrillation.  Hence not on aspirin. Asymptomatic with functional status limited due to age and deconditioning. No active symptoms suggestive of angina.       Chronic systolic congestive heart failure (HCC)   Reduced LVEF 35 to 40% on echocardiogram May 2025. Appears euvolemic and compensated. Weight 129 pounds which appears to have been stable. Remains on torsemide  40 mg once daily. Remains on low-salt diet.  Advised to continue monitoring.  Guideline directed medical therapy limited due to soft blood  pressures and orthostatic hypotension. Remains on midodrine  3 times daily for blood pressure optimization.  Continue with watchful monitoring and fluid status maintenance.          History of Present Illness:    Greg Oconnor is a 89 y.o. male who is being seen today for t follow-up visit. PCP is Abbe Hoard., MD.  Last visit at our office was 05/18/2023 with Pattricia Bores, NP-C. Currently a resident of [facility]. Here for the visit today accompanied by 2 of his daughters.  History of complete heart block s/p [employer] pacemaker in 2020, chronic heart failure with reduced ejection fraction, recent LVEF 35 to 40% on echocardiogram May 2025, mild MR, CAD s/p stenting and CABG in 2005, hypertension, paroxysmal atrial fibrillation, diabetes mellitus, CKD stage III, GERD, chronic anemia, hyperlipidemia and statin intolerance, orthostatic hypotension.  Blood work from 05-17-2023 at [facility] noted sodium 139, potassium 3.3, BUN 42, creatinine 1.5 and EGFR 43 suggest CKD stage III Magnesium 2  At the retirement community assisted living facility he was able to ambulate by himself using his wheelchair.  Able to move in and out of his wheelchair to the bed and use the restroom by himself.  Meals are prepared in the dining hall where he consumes them.  He is on a low-salt diet.  Has home health.  Has been taking medications consistently as provided by the assisted living facility.  Good compliance.  Weight has consistently remained around 129 pounds. He denies any chest pain, shortness of breath, paroxysmal nocturnal dyspnea. Open edema. Denies  any blood in urine or stools.  Denies any palpitations, syncopal episodes,  Has last device check 05-11-2023 with normal device functioning.  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 07/19/2015   BPH (benign prostatic hyperplasia) 07/20/2015   CAD, multiple vessel 07/20/2015   Cardiomyopathy (HCC) 07/20/2015   CHF exacerbation (HCC) 06/07/2022   Chronic diarrhea 11/20/2018   Chronic systolic congestive heart failure (HCC) 12/14/2021   Coronary artery disease involving native coronary artery of native heart with angina pectoris (HCC) 07/20/2015   Diabetes mellitus with stage 3 chronic kidney disease (HCC) 07/20/2015   Essential hypertension 07/20/2015   GERD without esophagitis 07/20/2015   Hepatitis A    History of pulmonary embolism 07/20/2015   Hypertensive heart disease without heart failure 06/17/2014   Malaise and fatigue 07/20/2015   Mixed hyperlipidemia 07/20/2015   Myalgia due to statin 06/08/2020   Old myocardial infarction 07/20/2015   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 OLD ATRIAL LEAD failure   Paroxysmal atrial fibrillation (HCC) 05/31/2015   Pyuria 06/06/2019   Respiratory distress 06/07/2022   Stage 3 chronic kidney disease (HCC) 07/20/2015   Statin intolerance 10/13/2018   Subconjunctival hemorrhage 07/20/2015   Type 2 diabetes mellitus without complication, without long-term current use of insulin (HCC) 07/20/2015   Vitamin B12 deficiency 07/20/2015    Past Surgical History:  Procedure Laterality Date   APPENDECTOMY     CHOLECYSTECTOMY     CORONARY ARTERY BYPASS GRAFT     CYSTOSCOPY     INSERT / REPLACE / REMOVE PACEMAKER     [employer] Jude   PPM GENERATOR CHANGEOUT N/A 10/25/2018   Procedure: PPM GENERATOR CHANGEOUT;  Surgeon: Lei Pump, MD;   Location: [facility] INVASIVE CV LAB;  Service: Cardiovascular;  Laterality: N/A;   PROSTATECTOMY      Current Medications: Current Meds  Medication Sig   acetaminophen  (TYLENOL ) 325 MG tablet Take 2 tablets by mouth every 6 (six) hours as needed for moderate pain or headache.   cetirizine (ZYRTEC) 10 MG tablet Take 10 mg by mouth daily as needed for allergies.   ELIQUIS  2.5 MG TABS tablet TAKE 1 TABLET BY MOUTH TWICE DAILY   folic acid (FOLVITE) 1 MG tablet Take 1 tablet by mouth daily.   levothyroxine (SYNTHROID) 75 MCG tablet Take 75 mcg by mouth daily.   midodrine  (PROAMATINE ) 10 MG tablet Take 10 mg by mouth 3 (three) times daily.   nitroGLYCERIN  (NITROSTAT ) 0.4 MG SL tablet Place 1 tablet (0.4 mg total) under the tongue every 5 (five) minutes as needed for chest pain.   omeprazole (PRILOSEC) 20 MG capsule Take 20 mg by mouth daily.   torsemide  (DEMADEX ) 20 MG tablet Take 40 mg by mouth daily.   vitamin B-12 (CYANOCOBALAMIN) 500 MCG tablet Take 1,000 mcg by mouth daily.   VITAMIN D, CHOLECALCIFEROL, PO Take 1 capsule by mouth daily.     Allergies:   Ezetimibe; Fenofibrate; Fenofibrate micronized; Fish oil; Niacin; Promethazine; Simvastatin; and Tetanus toxoid, adsorbed   Social History   Socioeconomic  History   Marital status: Married    Spouse name: Not on file   Number of children: Not on file   Years of education: Not on file   Highest education level: Not on file  Occupational History   Not on file  Tobacco Use   Smoking status: Former    Types: Cigarettes    Passive exposure: Past   Smokeless tobacco: Never  Vaping Use   Vaping status: Never Used  Substance and Sexual Activity   Alcohol use: Not Currently   Drug use: Never   Sexual activity: Not on file  Other Topics Concern   Not on file  Social History Narrative   Not on file   Social Drivers of Health   Financial Resource Strain: Not on file  Food Insecurity: Not on file  Transportation Needs: Not on file   Physical Activity: Not on file  Stress: Not on file  Social Connections: Not on file     Family History: The patient's family history includes Cancer in his father; Heart Problems in his brother and mother; Parkinson's disease in his brother; Stroke in his mother. ROS:   Please see the history of present illness.    All 14 point review of systems negative except as described per history of present illness.  EKGs/Labs/Other Studies Reviewed:    The following studies were reviewed today:   EKG:       Recent Labs: No results found for requested labs within last 365 days.  Recent Lipid Panel    Component Value Date/Time   CHOL 171 04/28/2020 1043   TRIG 103 04/28/2020 1043   HDL 53 04/28/2020 1043   CHOLHDL 3.2 04/28/2020 1043   LDLCALC 99 04/28/2020 1043    Physical Exam:    VS:  BP 114/60   Pulse 80   Ht 5' 7.6 (1.717 m)   Wt 129 lb 3.2 oz (58.6 kg)   SpO2 96%   BMI 19.88 kg/m     Wt Readings from Last 3 Encounters:  06/28/23 129 lb 3.2 oz (58.6 kg)  05/18/23 139 lb (63 kg)  12/18/22 136 lb (61.7 kg)     GENERAL:  Well nourished, well developed in no acute distress.  Sitting comfortably in the wheelchair. NECK: No JVD; No carotid bruits CARDIAC: RRR, S1 and S2 present, 3/6 soft murmur heard left parasternal region.  CHEST:  Clear to auscultation without rales, wheezing or rhonchi  Extremities: No pitting pedal edema. Pulses bilaterally symmetric with radial 2+ and dorsalis pedis 2+ NEUROLOGIC:  Alert and oriented x 3  Medication Adjustments/Labs and Tests Ordered: Current medicines are reviewed at length with the patient today.  Concerns regarding medicines are outlined above.  No orders of the defined types were placed in this encounter.  No orders of the defined types were placed in this encounter.   Signed, Lura Sallies, MD, MPH, [facility]. 06/28/2023 4:27 PM    [facility]

## 2023-07-04 DIAGNOSIS — I5022 Chronic systolic (congestive) heart failure: Secondary | ICD-10-CM | POA: Diagnosis not present

## 2023-07-04 DIAGNOSIS — J069 Acute upper respiratory infection, unspecified: Secondary | ICD-10-CM | POA: Diagnosis not present

## 2023-07-04 DIAGNOSIS — F0390 Unspecified dementia without behavioral disturbance: Secondary | ICD-10-CM

## 2023-07-04 DIAGNOSIS — R600 Localized edema: Secondary | ICD-10-CM

## 2023-07-04 DIAGNOSIS — R0989 Other specified symptoms and signs involving the circulatory and respiratory systems: Secondary | ICD-10-CM | POA: Diagnosis not present

## 2023-07-04 DIAGNOSIS — R059 Cough, unspecified: Secondary | ICD-10-CM | POA: Diagnosis not present

## 2023-07-06 DIAGNOSIS — J0100 Acute maxillary sinusitis, unspecified: Secondary | ICD-10-CM | POA: Diagnosis not present

## 2023-07-06 DIAGNOSIS — R0989 Other specified symptoms and signs involving the circulatory and respiratory systems: Secondary | ICD-10-CM | POA: Diagnosis not present

## 2023-07-06 DIAGNOSIS — R5381 Other malaise: Secondary | ICD-10-CM | POA: Diagnosis not present

## 2023-07-06 DIAGNOSIS — F0390 Unspecified dementia without behavioral disturbance: Secondary | ICD-10-CM | POA: Diagnosis not present

## 2023-07-18 DIAGNOSIS — N1831 Chronic kidney disease, stage 3a: Secondary | ICD-10-CM | POA: Diagnosis not present

## 2023-07-18 DIAGNOSIS — F419 Anxiety disorder, unspecified: Secondary | ICD-10-CM

## 2023-07-18 DIAGNOSIS — F03918 Unspecified dementia, unspecified severity, with other behavioral disturbance: Secondary | ICD-10-CM

## 2023-07-18 DIAGNOSIS — E1165 Type 2 diabetes mellitus with hyperglycemia: Secondary | ICD-10-CM | POA: Diagnosis not present

## 2023-07-18 DIAGNOSIS — R600 Localized edema: Secondary | ICD-10-CM | POA: Diagnosis not present

## 2023-07-18 DIAGNOSIS — R451 Restlessness and agitation: Secondary | ICD-10-CM

## 2023-07-18 DIAGNOSIS — I5022 Chronic systolic (congestive) heart failure: Secondary | ICD-10-CM | POA: Diagnosis not present

## 2023-07-30 DIAGNOSIS — F0390 Unspecified dementia without behavioral disturbance: Secondary | ICD-10-CM | POA: Diagnosis not present

## 2023-07-30 DIAGNOSIS — G4700 Insomnia, unspecified: Secondary | ICD-10-CM | POA: Diagnosis not present

## 2023-08-10 ENCOUNTER — Ambulatory Visit (INDEPENDENT_AMBULATORY_CARE_PROVIDER_SITE_OTHER)

## 2023-08-10 DIAGNOSIS — I5022 Chronic systolic (congestive) heart failure: Secondary | ICD-10-CM

## 2023-08-10 LAB — CUP PACEART REMOTE DEVICE CHECK
Battery Remaining Longevity: 66 mo
Battery Remaining Percentage: 55 %
Battery Voltage: 2.99 V
Brady Statistic AS VP Percent: 94 %
Brady Statistic AS VS Percent: 1.9 %
Brady Statistic RV Percent Paced: 96 %
Date Time Interrogation Session: 20250801020019
Implantable Lead Connection Status: 753985
Implantable Lead Connection Status: 753985
Implantable Lead Implant Date: 19980122
Implantable Lead Implant Date: 20040315
Implantable Lead Location: 753859
Implantable Lead Location: 753860
Implantable Pulse Generator Implant Date: 20201016
Lead Channel Impedance Value: 400 Ohm
Lead Channel Impedance Value: 610 Ohm
Lead Channel Pacing Threshold Amplitude: 0.5 V
Lead Channel Pacing Threshold Pulse Width: 0.5 ms
Lead Channel Sensing Intrinsic Amplitude: 10.8 mV
Lead Channel Sensing Intrinsic Amplitude: 2.5 mV
Lead Channel Setting Pacing Amplitude: 2.5 V
Lead Channel Setting Pacing Pulse Width: 0.5 ms
Lead Channel Setting Sensing Sensitivity: 4 mV
Pulse Gen Model: 2272
Pulse Gen Serial Number: 9168465

## 2023-08-11 ENCOUNTER — Ambulatory Visit: Payer: Self-pay | Admitting: Cardiology

## 2023-08-15 DIAGNOSIS — S5012XA Contusion of left forearm, initial encounter: Secondary | ICD-10-CM | POA: Diagnosis not present

## 2023-08-15 DIAGNOSIS — F0390 Unspecified dementia without behavioral disturbance: Secondary | ICD-10-CM | POA: Diagnosis not present

## 2023-08-15 DIAGNOSIS — I4891 Unspecified atrial fibrillation: Secondary | ICD-10-CM | POA: Diagnosis not present

## 2023-08-15 DIAGNOSIS — G4700 Insomnia, unspecified: Secondary | ICD-10-CM | POA: Diagnosis not present

## 2023-08-23 DIAGNOSIS — F0390 Unspecified dementia without behavioral disturbance: Secondary | ICD-10-CM | POA: Diagnosis not present

## 2023-08-23 DIAGNOSIS — I5022 Chronic systolic (congestive) heart failure: Secondary | ICD-10-CM | POA: Diagnosis not present

## 2023-08-23 DIAGNOSIS — R600 Localized edema: Secondary | ICD-10-CM | POA: Diagnosis not present

## 2023-08-23 DIAGNOSIS — M6281 Muscle weakness (generalized): Secondary | ICD-10-CM | POA: Diagnosis not present

## 2023-08-27 DIAGNOSIS — F0390 Unspecified dementia without behavioral disturbance: Secondary | ICD-10-CM

## 2023-08-27 DIAGNOSIS — I4891 Unspecified atrial fibrillation: Secondary | ICD-10-CM | POA: Diagnosis not present

## 2023-08-27 DIAGNOSIS — R635 Abnormal weight gain: Secondary | ICD-10-CM | POA: Diagnosis not present

## 2023-08-27 DIAGNOSIS — R600 Localized edema: Secondary | ICD-10-CM | POA: Diagnosis not present

## 2023-08-27 DIAGNOSIS — M6281 Muscle weakness (generalized): Secondary | ICD-10-CM

## 2023-08-27 DIAGNOSIS — I5022 Chronic systolic (congestive) heart failure: Secondary | ICD-10-CM | POA: Diagnosis not present

## 2023-08-29 DIAGNOSIS — R059 Cough, unspecified: Secondary | ICD-10-CM | POA: Diagnosis not present

## 2023-08-29 DIAGNOSIS — I5022 Chronic systolic (congestive) heart failure: Secondary | ICD-10-CM

## 2023-08-29 DIAGNOSIS — R600 Localized edema: Secondary | ICD-10-CM

## 2023-08-29 DIAGNOSIS — F0390 Unspecified dementia without behavioral disturbance: Secondary | ICD-10-CM | POA: Diagnosis not present

## 2023-08-29 DIAGNOSIS — R5381 Other malaise: Secondary | ICD-10-CM | POA: Diagnosis not present

## 2023-08-29 DIAGNOSIS — J189 Pneumonia, unspecified organism: Secondary | ICD-10-CM | POA: Diagnosis not present

## 2023-08-30 DIAGNOSIS — R918 Other nonspecific abnormal finding of lung field: Secondary | ICD-10-CM

## 2023-08-30 DIAGNOSIS — J189 Pneumonia, unspecified organism: Secondary | ICD-10-CM | POA: Diagnosis not present

## 2023-08-30 DIAGNOSIS — R059 Cough, unspecified: Secondary | ICD-10-CM

## 2023-08-30 DIAGNOSIS — D649 Anemia, unspecified: Secondary | ICD-10-CM | POA: Diagnosis not present

## 2023-08-30 DIAGNOSIS — R5381 Other malaise: Secondary | ICD-10-CM

## 2023-08-30 DIAGNOSIS — F0390 Unspecified dementia without behavioral disturbance: Secondary | ICD-10-CM | POA: Diagnosis not present

## 2023-08-30 DIAGNOSIS — I5022 Chronic systolic (congestive) heart failure: Secondary | ICD-10-CM | POA: Diagnosis not present

## 2023-09-03 DIAGNOSIS — F0390 Unspecified dementia without behavioral disturbance: Secondary | ICD-10-CM

## 2023-09-03 DIAGNOSIS — R600 Localized edema: Secondary | ICD-10-CM | POA: Diagnosis not present

## 2023-09-03 DIAGNOSIS — I5022 Chronic systolic (congestive) heart failure: Secondary | ICD-10-CM | POA: Diagnosis not present

## 2023-09-03 DIAGNOSIS — J189 Pneumonia, unspecified organism: Secondary | ICD-10-CM | POA: Diagnosis not present

## 2023-09-03 DIAGNOSIS — J309 Allergic rhinitis, unspecified: Secondary | ICD-10-CM | POA: Diagnosis not present

## 2023-09-12 DIAGNOSIS — I509 Heart failure, unspecified: Secondary | ICD-10-CM | POA: Diagnosis not present

## 2023-09-12 DIAGNOSIS — I480 Paroxysmal atrial fibrillation: Secondary | ICD-10-CM

## 2023-09-12 DIAGNOSIS — I4510 Unspecified right bundle-branch block: Secondary | ICD-10-CM | POA: Diagnosis not present

## 2023-09-12 DIAGNOSIS — I951 Orthostatic hypotension: Secondary | ICD-10-CM | POA: Diagnosis not present

## 2023-09-12 DIAGNOSIS — Z950 Presence of cardiac pacemaker: Secondary | ICD-10-CM | POA: Diagnosis not present

## 2023-09-12 DIAGNOSIS — N189 Chronic kidney disease, unspecified: Secondary | ICD-10-CM | POA: Diagnosis not present

## 2023-09-17 ENCOUNTER — Ambulatory Visit: Admitting: Cardiology

## 2023-09-19 DIAGNOSIS — J9601 Acute respiratory failure with hypoxia: Secondary | ICD-10-CM

## 2023-09-19 DIAGNOSIS — F0390 Unspecified dementia without behavioral disturbance: Secondary | ICD-10-CM

## 2023-09-19 DIAGNOSIS — I509 Heart failure, unspecified: Secondary | ICD-10-CM | POA: Diagnosis not present

## 2023-09-19 DIAGNOSIS — N1832 Chronic kidney disease, stage 3b: Secondary | ICD-10-CM | POA: Diagnosis not present

## 2023-09-19 DIAGNOSIS — J449 Chronic obstructive pulmonary disease, unspecified: Secondary | ICD-10-CM

## 2023-09-19 DIAGNOSIS — I5043 Acute on chronic combined systolic (congestive) and diastolic (congestive) heart failure: Secondary | ICD-10-CM | POA: Diagnosis not present

## 2023-09-19 DIAGNOSIS — I951 Orthostatic hypotension: Secondary | ICD-10-CM

## 2023-09-19 DIAGNOSIS — D649 Anemia, unspecified: Secondary | ICD-10-CM

## 2023-09-19 DIAGNOSIS — I4891 Unspecified atrial fibrillation: Secondary | ICD-10-CM

## 2023-09-19 DIAGNOSIS — Z9889 Other specified postprocedural states: Secondary | ICD-10-CM

## 2023-09-19 DIAGNOSIS — J90 Pleural effusion, not elsewhere classified: Secondary | ICD-10-CM | POA: Diagnosis not present

## 2023-09-28 DIAGNOSIS — Z23 Encounter for immunization: Secondary | ICD-10-CM | POA: Diagnosis not present

## 2023-10-01 NOTE — Progress Notes (Unsigned)
 Cardiology Office Note:    Date:  10/04/2023   ID:  Greg Oconnor, DOB 01/18/1925, MRN [id number]  PCP:  Caleen Dirks, MD  Cardiologist:  Redell Leiter, MD    Referring MD: Caleen Dirks, MD    ASSESSMENT:    1. Refractory heart failure (HCC)   2. Orthostatic hypotension   3. Pacemaker   4. Paroxysmal atrial fibrillation (HCC)   5. Pleural effusion    PLAN:    In order of problems listed above:  Clearly he has continued to progress now he has stage III refractory heart failure and the transition from palliative care to hospice is very and embraced by the patient and his family They clearly wish to avoid hospitalizations Diuretic therapy increasing torsemide  and using the subcutaneous furosemide  preparation as needed to avoid decompensation hospitalizations They tell me he had lab work performed at his assisted living Pacemaker and atrial fibrillation are stable The family would like him to have repeat thoracentesis if needed   Next appointment: See in 3 months once he joins hospice care will be directed primarily by the hospice team   Medication Adjustments/Labs and Tests Ordered: Current medicines are reviewed at length with the patient today.  Concerns regarding medicines are outlined above.  No orders of the defined types were placed in this encounter.  No orders of the defined types were placed in this encounter.    History of Present Illness:    Greg Oconnor is a 89 y.o. male with a hx of complex heart disease including hypertensive heart disease or chronic systolic heart failure heart block with permanent pacemaker atrial fibrillation long-term anticoagulation stage III CKD coronary artery disease hyperlipidemia and symptomatic orthostatic hypotension requiring midodrine  and Florinef for blood pressure support last seen recently as an inpatient at [facility].  At that time he had worsened heart failure large pleural effusion and had therapeutic  thoracentesis.  Overall his care had become palliative.  I am told by my office staff that the family wants to come and discuss hospice care.  Records show a chest x-ray 09/13/2023 showed findings of congestive heart failure.  He is admitted to the hospital with the same he had a pleural effusion and he had a therapeutic thoracentesis.  Seen by me I met with the family and he has had a slow steady progression to refractory stage D heart failure requiring midodrine  for symptomatic hypotension and care was palliative he was a DNR during that hospitalization my notes that the family had a good healthcare knowledge and understood goals of his treatment.  His EKG showed atrial sensing and ventricular pacing.  Compliance with diet, lifestyle and medications: Yes  2 issues the first is family continues to discussion of transition from palliative care to hospice he clearly has stage D refractory heart failure each time he is admitted to the hospital deteriorates his expected life is less than 6 months and I think he would benefit from the resources of hospice and the family and the patient agree  Second is having trouble with increasing edema does increase his torsemide  to 40 in the morning 20 in the evening and a day it is 127 pound so you subcutaneous furosemide  replacing his AM  Past Medical History:  Diagnosis Date   Acute combined systolic and diastolic congestive heart failure (HCC) 01/18/2022   Anemia, chronic disease 05/12/2016   Anticoagulant long-term use 07/19/2015   Anticoagulated 06/17/2014   Atrial flutter (HCC) 06/07/2022   Atrioventricular block, complete (HCC)  12/31/2014   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 07/19/2015   BPH (benign prostatic hyperplasia) 07/20/2015   CAD, multiple vessel 07/20/2015   Cardiomyopathy (HCC) 07/20/2015   CHF exacerbation (HCC) 06/07/2022   Chronic diarrhea 11/20/2018   Chronic systolic congestive heart failure (HCC) 12/14/2021    Coronary artery disease involving native coronary artery of native heart with angina pectoris 07/20/2015   Diabetes mellitus with stage 3 chronic kidney disease (HCC) 07/20/2015   Essential hypertension 07/20/2015   GERD without esophagitis 07/20/2015   Hepatitis A    History of pulmonary embolism 07/20/2015   Hypertensive heart disease without heart failure 06/17/2014   Malaise and fatigue 07/20/2015   Mixed hyperlipidemia 07/20/2015   Myalgia due to statin 06/08/2020   Old myocardial infarction 07/20/2015   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 OLD ATRIAL LEAD failure   Paroxysmal atrial fibrillation (HCC) 05/31/2015   Pyuria 06/06/2019   Respiratory distress 06/07/2022   Stage 3 chronic kidney disease (HCC) 07/20/2015   Statin intolerance 10/13/2018   Subconjunctival hemorrhage 07/20/2015   Type 2 diabetes mellitus without complication, without long-term current use of insulin (HCC) 07/20/2015   Vitamin B12 deficiency 07/20/2015    Current Medications: Current Meds  Medication Sig   acetaminophen  (TYLENOL ) 325 MG tablet Take 2 tablets by mouth every 6 (six) hours as needed for moderate pain or headache.   cetirizine (ZYRTEC) 10 MG tablet Take 10 mg by mouth daily as needed for allergies.   ELIQUIS  2.5 MG TABS tablet TAKE 1 TABLET BY MOUTH TWICE DAILY   folic acid (FOLVITE) 1 MG tablet Take 1 tablet by mouth daily.   Fructose-Dextrose -Phosphor Acd (ANTI-NAUSEA) 1.87-1.87-21.5 SOLN TAKE 15 ML BY MOUTH ASVDIRECTED EVERY 15 MINUTESOAS NEEDED FOR NAUSEA   GNP CLEARLAX 17 GM/SCOOP powder Take 510 g by mouth daily as needed.   levothyroxine (SYNTHROID) 75 MCG tablet Take 75 mcg by mouth daily.   LORazepam (ATIVAN) 0.5 MG tablet Take 0.25 mg by mouth daily as needed.   melatonin 3 MG TABS tablet Take  6 mg by mouth at bedtime.   midodrine  (PROAMATINE ) 10 MG tablet Take 10 mg by mouth 3 (three) times daily.   nitroGLYCERIN  (NITROSTAT ) 0.4 MG SL tablet Place 1 tablet (0.4 mg total) under the tongue every 5 (five) minutes as needed for chest pain.   omeprazole (PRILOSEC) 20 MG capsule Take 20 mg by mouth daily.   torsemide  (DEMADEX ) 20 MG tablet Take 40 mg by mouth daily.   vitamin B-12 (CYANOCOBALAMIN) 500 MCG tablet Take 1,000 mcg by mouth daily.   VITAMIN D, CHOLECALCIFEROL, PO Take 1 capsule by mouth daily.      EKGs/Labs/Other Studies Reviewed:    The following studies were reviewed today:  Cardiac Studies & Procedures   ______________________________________________________________________________________________     ECHOCARDIOGRAM  ECHOCARDIOGRAM COMPLETE 04/24/2017          ______________________________________________________________________________________________          Recent Labs: No results found for requested labs within last 365 days.  Recent Lipid Panel    Component Value Date/Time   CHOL 171 04/28/2020 1043   TRIG 103 04/28/2020 1043   HDL 53 04/28/2020 1043   CHOLHDL 3.2 04/28/2020 1043   LDLCALC 99 04/28/2020 1043    Physical Exam:    VS:  BP 120/68  Pulse 78   Ht 5' 7 (1.702 m)   Wt 126 lb 12.8 oz (57.5 kg)   SpO2 97%   BMI 19.86 kg/m     Wt Readings from Last 3 Encounters:  10/04/23 126 lb 12.8 oz (57.5 kg)  06/28/23 129 lb 3.2 oz (58.6 kg)  05/18/23 139 lb (63 kg)     GEN: He looks very frail poor muscle mass  in no acute distress HEENT: Normal NECK: No JVD; No carotid bruits LYMPHATICS: No lymphadenopathy CARDIAC: S3 RR no murmurs, rubs, gallops RESPIRATORY:  Clear to auscultation without rales, wheezing or rhonchi  ABDOMEN: Soft, non-tender, non-distended MUSCULOSKELETAL:  No edema; No deformity  SKIN: Warm and dry NEUROLOGIC:  Alert and oriented x 3 PSYCHIATRIC:  Normal affect    Signed, Redell Leiter, MD   10/04/2023 11:54 AM    [facility]

## 2023-10-03 ENCOUNTER — Ambulatory Visit: Admitting: Cardiology

## 2023-10-03 NOTE — Progress Notes (Signed)
 Remote PPM Transmission

## 2023-10-04 ENCOUNTER — Ambulatory Visit: Attending: Cardiology | Admitting: Cardiology

## 2023-10-04 ENCOUNTER — Encounter: Payer: Self-pay | Admitting: Cardiology

## 2023-10-04 ENCOUNTER — Telehealth: Payer: Self-pay | Admitting: Cardiology

## 2023-10-04 ENCOUNTER — Other Ambulatory Visit (HOSPITAL_COMMUNITY): Payer: Self-pay

## 2023-10-04 ENCOUNTER — Telehealth: Payer: Self-pay

## 2023-10-04 VITALS — BP 120/68 | HR 78 | Ht 67.0 in | Wt 126.8 lb

## 2023-10-04 DIAGNOSIS — I509 Heart failure, unspecified: Secondary | ICD-10-CM

## 2023-10-04 DIAGNOSIS — I5084 End stage heart failure: Secondary | ICD-10-CM

## 2023-10-04 DIAGNOSIS — I951 Orthostatic hypotension: Secondary | ICD-10-CM

## 2023-10-04 DIAGNOSIS — F0390 Unspecified dementia without behavioral disturbance: Secondary | ICD-10-CM

## 2023-10-04 DIAGNOSIS — J90 Pleural effusion, not elsewhere classified: Secondary | ICD-10-CM

## 2023-10-04 DIAGNOSIS — I480 Paroxysmal atrial fibrillation: Secondary | ICD-10-CM

## 2023-10-04 DIAGNOSIS — M6281 Muscle weakness (generalized): Secondary | ICD-10-CM

## 2023-10-04 DIAGNOSIS — I429 Cardiomyopathy, unspecified: Secondary | ICD-10-CM

## 2023-10-04 DIAGNOSIS — Z950 Presence of cardiac pacemaker: Secondary | ICD-10-CM | POA: Diagnosis not present

## 2023-10-04 DIAGNOSIS — I2510 Atherosclerotic heart disease of native coronary artery without angina pectoris: Secondary | ICD-10-CM

## 2023-10-04 DIAGNOSIS — N183 Chronic kidney disease, stage 3 unspecified: Secondary | ICD-10-CM

## 2023-10-04 MED ORDER — TORSEMIDE 20 MG PO TABS: ORAL_TABLET | ORAL | 3 refills | Status: DC

## 2023-10-04 MED ORDER — FUROSCIX 80 MG/10ML ~~LOC~~ CTKT: 1.0000 [IU] | CARTRIDGE | Freq: Every day | SUBCUTANEOUS | 3 refills | Status: DC | PRN

## 2023-10-04 NOTE — Telephone Encounter (Signed)
 Pt was prescribed furoscix  today, however [facility] is unable to give that medication and they also talked to hospice care today where they said the couldn't not give that medication either. PT daughter Nathanel asked for a call back from [facility] [place].

## 2023-10-04 NOTE — Telephone Encounter (Signed)
 The patient's family was asking how much the Furoscix  medication would cost him? Please advise.

## 2023-10-04 NOTE — Patient Instructions (Signed)
 Medication Instructions:  Your physician has recommended you make the following change in your medication:   START: Torsemide  20 mg  (Take 40 mg in the AM, then 20 mg in the PM)  If his weight is 127 lbs or greater take Furoscix  in place of the AM Torsemide   *If you need a refill on your cardiac medications before your next appointment, please call your pharmacy*  Lab Work: None If you have labs (blood work) drawn today and your tests are completely normal, you will receive your results only by: MyChart Message (if you have MyChart) OR A paper copy in the mail If you have any lab test that is abnormal or we need to change your treatment, we will call you to review the results.  Testing/Procedures: None  Follow-Up: At [facility], you and your health needs are our priority.  As part of our continuing mission to provide you with exceptional heart care, our providers are all part of one team.  This team includes your primary Cardiologist (physician) and Advanced Practice Providers or APPs (Physician Assistants and Nurse Practitioners) who all work together to provide you with the care you need, when you need it.  Your next appointment:   3 month(s)  Provider:   Redell Leiter, MD    We recommend signing up for the patient portal called MyChart.  Sign up information is provided on this After Visit Summary.  MyChart is used to connect with patients for Virtual Visits (Telemedicine).  Patients are able to view lab/test results, encounter notes, upcoming appointments, etc.  Non-urgent messages can be sent to your provider as well.   To learn more about what you can do with MyChart, go to [email or web address].   Other Instructions None

## 2023-10-04 NOTE — Telephone Encounter (Signed)
 I won't be able to tell cost until PA is approved. Submitted PA, status is pending.

## 2023-10-05 ENCOUNTER — Other Ambulatory Visit: Payer: Self-pay

## 2023-10-05 NOTE — Telephone Encounter (Signed)
 Called the patient's daughter per DPR and left a detailed message explaining Dr. Leandrew response below:  Greg Oconnor, hospice will take over leading his care they can certainly reach out to me if I can be beneficial

## 2023-10-05 NOTE — Telephone Encounter (Signed)
 Spoke with the daughter of the patient and she stated that both his assisted living facility and hospice state that they are not able to give the Furoscix  medication. The daughter also stated that they spoke with the family yesterday and they are going to enroll the patient in hospice and the family is asking that the Furoscix  medication be discontinued. I explained that I would speak with Dr. Monetta to see if there were any other medications that the patient needed in place of the Furoscix . The patient's daughter verbalized understanding and asked whether or not Dr. Monetta needed to be kept in the loop regarding the patient's hospice care.I explained that I would ask him to find out. The patient's daughter had no further questions at this time.

## 2023-10-25 DIAGNOSIS — J9601 Acute respiratory failure with hypoxia: Secondary | ICD-10-CM | POA: Diagnosis not present

## 2023-10-25 DIAGNOSIS — F0390 Unspecified dementia without behavioral disturbance: Secondary | ICD-10-CM | POA: Diagnosis not present

## 2023-10-25 DIAGNOSIS — I509 Heart failure, unspecified: Secondary | ICD-10-CM | POA: Diagnosis not present

## 2023-10-25 DIAGNOSIS — R5381 Other malaise: Secondary | ICD-10-CM | POA: Diagnosis not present

## 2023-11-09 ENCOUNTER — Encounter

## 2023-11-10 DEATH — deceased

## 2024-01-01 ENCOUNTER — Ambulatory Visit: Admitting: Cardiology

## 2024-02-08 ENCOUNTER — Encounter

## 2024-05-09 ENCOUNTER — Encounter
